# Patient Record
Sex: Female | Born: 1970 | Race: White | Hispanic: No | Marital: Married | Smoking: Never smoker
Health system: Southern US, Community
[De-identification: ages and names within clinical notes are randomized; demographics above are authoritative.]

## PROBLEM LIST (undated history)

## (undated) DIAGNOSIS — Z973 Presence of spectacles and contact lenses: Secondary | ICD-10-CM

## (undated) DIAGNOSIS — T753XXA Motion sickness, initial encounter: Secondary | ICD-10-CM

## (undated) DIAGNOSIS — R112 Nausea with vomiting, unspecified: Secondary | ICD-10-CM

## (undated) DIAGNOSIS — Z9889 Other specified postprocedural states: Secondary | ICD-10-CM

## (undated) DIAGNOSIS — E785 Hyperlipidemia, unspecified: Secondary | ICD-10-CM

## (undated) DIAGNOSIS — L719 Rosacea, unspecified: Secondary | ICD-10-CM

## (undated) DIAGNOSIS — K219 Gastro-esophageal reflux disease without esophagitis: Secondary | ICD-10-CM

## (undated) DIAGNOSIS — C4491 Basal cell carcinoma of skin, unspecified: Secondary | ICD-10-CM

## (undated) DIAGNOSIS — Z8489 Family history of other specified conditions: Secondary | ICD-10-CM

## (undated) DIAGNOSIS — G43909 Migraine, unspecified, not intractable, without status migrainosus: Secondary | ICD-10-CM

## (undated) HISTORY — PX: MOHS SURGERY: SUR867

## (undated) HISTORY — DX: Rosacea, unspecified: L71.9

## (undated) HISTORY — PX: WISDOM TOOTH EXTRACTION: SHX21

## (undated) HISTORY — DX: Gastro-esophageal reflux disease without esophagitis: K21.9

## (undated) HISTORY — DX: Basal cell carcinoma of skin, unspecified: C44.91

## (undated) HISTORY — DX: Hyperlipidemia, unspecified: E78.5

---

## 2015-07-12 ENCOUNTER — Other Ambulatory Visit: Payer: Self-pay | Admitting: Obstetrics and Gynecology

## 2015-07-12 DIAGNOSIS — Z1231 Encounter for screening mammogram for malignant neoplasm of breast: Secondary | ICD-10-CM

## 2015-07-20 ENCOUNTER — Ambulatory Visit: Payer: Self-pay

## 2015-07-22 ENCOUNTER — Ambulatory Visit
Admission: RE | Admit: 2015-07-22 | Discharge: 2015-07-22 | Disposition: A | Discharge status: Home / Self Care | Payer: BLUE CROSS/BLUE SHIELD | Source: Ambulatory Visit | Attending: Obstetrics and Gynecology | Admitting: Obstetrics and Gynecology

## 2015-07-22 ENCOUNTER — Other Ambulatory Visit: Payer: Self-pay | Admitting: Obstetrics and Gynecology

## 2015-07-22 DIAGNOSIS — Z1231 Encounter for screening mammogram for malignant neoplasm of breast: Secondary | ICD-10-CM

## 2016-07-18 ENCOUNTER — Other Ambulatory Visit: Payer: Self-pay | Admitting: Obstetrics and Gynecology

## 2016-07-18 DIAGNOSIS — Z1231 Encounter for screening mammogram for malignant neoplasm of breast: Secondary | ICD-10-CM

## 2017-10-02 ENCOUNTER — Other Ambulatory Visit: Payer: Self-pay | Admitting: Obstetrics and Gynecology

## 2017-10-02 DIAGNOSIS — Z1239 Encounter for other screening for malignant neoplasm of breast: Secondary | ICD-10-CM

## 2017-10-25 DIAGNOSIS — C4491 Basal cell carcinoma of skin, unspecified: Secondary | ICD-10-CM

## 2017-10-25 HISTORY — DX: Basal cell carcinoma of skin, unspecified: C44.91

## 2017-11-15 ENCOUNTER — Encounter: Payer: Self-pay | Admitting: Radiology

## 2017-11-15 ENCOUNTER — Ambulatory Visit
Admission: RE | Admit: 2017-11-15 | Discharge: 2017-11-15 | Disposition: A | Discharge status: Home / Self Care | Payer: Commercial Managed Care - PPO | Source: Ambulatory Visit | Attending: Obstetrics and Gynecology | Admitting: Obstetrics and Gynecology

## 2017-11-15 DIAGNOSIS — Z1239 Encounter for other screening for malignant neoplasm of breast: Secondary | ICD-10-CM

## 2017-11-15 DIAGNOSIS — Z1231 Encounter for screening mammogram for malignant neoplasm of breast: Secondary | ICD-10-CM | POA: Insufficient documentation

## 2019-03-31 ENCOUNTER — Other Ambulatory Visit: Payer: Self-pay | Admitting: Obstetrics and Gynecology

## 2019-03-31 DIAGNOSIS — Z1231 Encounter for screening mammogram for malignant neoplasm of breast: Secondary | ICD-10-CM

## 2019-05-22 ENCOUNTER — Encounter: Payer: Self-pay | Admitting: Emergency Medicine

## 2019-05-22 ENCOUNTER — Ambulatory Visit
Admission: EM | Admit: 2019-05-22 | Discharge: 2019-05-22 | Disposition: A | Discharge status: Home / Self Care | Payer: Commercial Managed Care - PPO

## 2019-05-22 ENCOUNTER — Other Ambulatory Visit: Payer: Self-pay

## 2019-05-22 DIAGNOSIS — J206 Acute bronchitis due to rhinovirus: Secondary | ICD-10-CM

## 2019-05-22 MED ORDER — PROMETHAZINE-DM 6.25-15 MG/5ML PO SYRP: 5.0000 mL | ORAL_SOLUTION | Freq: Four times a day (QID) | ORAL | 0 refills | Status: DC | PRN

## 2019-05-22 MED ORDER — MONTELUKAST SODIUM 10 MG PO TABS: 10.0000 mg | ORAL_TABLET | Freq: Every day | ORAL | 0 refills | Status: DC

## 2019-05-22 MED ORDER — PREDNISONE 10 MG (21) PO TBPK: ORAL_TABLET | Freq: Every day | ORAL | 0 refills | Status: DC

## 2019-05-22 MED ORDER — DOXYCYCLINE HYCLATE 100 MG PO CAPS: 100.0000 mg | ORAL_CAPSULE | Freq: Two times a day (BID) | ORAL | 0 refills | Status: DC

## 2019-05-22 MED ORDER — DM-GUAIFENESIN ER 30-600 MG PO TB12: 1.0000 | ORAL_TABLET | Freq: Two times a day (BID) | ORAL | 0 refills | Status: AC

## 2019-05-22 NOTE — Discharge Instructions (Addendum)
Take medications as prescribed. Drink plenty of fluids. Follow-up with your primary care if no improvement in symptoms. Go to ED if worse. Hope you feel better soon!  Merry Christmas!  Aldona Bar, FNP-C

## 2019-05-22 NOTE — ED Triage Notes (Signed)
Patient in office today c/o persistent cough on and off since Feb.   [id number] DM

## 2019-05-22 NOTE — ED Provider Notes (Signed)
Roderic Palau    CSN: [id number] Arrival date & time: 05/22/19  1559      History   Chief Complaint Chief Complaint  Patient presents with  . Cough    HPI Robin Benton is a 48 y.o. female.   Subjective:   Robin Benton is a 48 y.o. female here for evaluation of a cough.  The cough has been waxing and waning over time. Onset of symptoms was 10 months ago.  Current symptoms have been going on for the past few days.  Associated symptoms include sputum production and congestion. She denies any fevers, chills, body aches, sore throat, shortness of breath, wheezing, nausea, vomiting, diarrhea, headache, dizziness or change in taste/smell.  Patient does not have a history of asthma. Patient has not had recent travel. Patient does not have a history of smoking.  Patient denies any known exposure to COVID-19.    The following portions of the patient's history were reviewed and updated as appropriate: allergies, current medications, past family history, past medical history, past social history, past surgical history and problem list.        History reviewed. No pertinent past medical history.  There are no problems to display for this patient.   History reviewed. No pertinent surgical history.  OB History   No obstetric history on file.      Home Medications    Prior to Admission medications   Medication Sig Start Date End Date Taking? Authorizing Provider  metroNIDAZOLE (METROCREAM) 0.75 % cream  10/25/17  Yes [provider]  dextromethorphan-guaiFENesin (MUCINEX DM) 30-600 MG 12hr tablet Take 1 tablet by mouth 2 (two) times daily for 7 days. 05/22/19 05/29/19  Enrique Sack, FNP  doxycycline (VIBRAMYCIN) 100 MG capsule Take 1 capsule (100 mg total) by mouth 2 (two) times daily. 05/22/19   Enrique Sack, FNP  montelukast (SINGULAIR) 10 MG tablet Take 1 tablet (10 mg total) by mouth at bedtime for 14 days. 05/22/19 06/05/19  Enrique Sack, FNP    predniSONE (STERAPRED UNI-PAK 21 TAB) 10 MG (21) TBPK tablet Take by mouth daily. Take 6 tabs by mouth daily  for 2 days, then 5 tabs for 2 days, then 4 tabs for 2 days, then 3 tabs for 2 days, 2 tabs for 2 days, then 1 tab by mouth daily for 2 days 05/22/19   Enrique Sack, FNP  promethazine-dextromethorphan (PROMETHAZINE-DM) 6.25-15 MG/5ML syrup Take 5 mLs by mouth 4 (four) times daily as needed for cough. 05/22/19   Enrique Sack, FNP    Family History Family History  Problem Relation Age of Onset  . Breast cancer Neg Hx     Social History Social History   Tobacco Use  . Smoking status: Never Smoker  . Smokeless tobacco: Never Used  Substance Use Topics  . Alcohol use: Never  . Drug use: Never     Allergies   Patient has no known allergies.   Review of Systems Review of Systems  Constitutional: Negative for fever.  HENT: Positive for congestion.   Respiratory: Positive for cough. Negative for shortness of breath and wheezing.   Gastrointestinal: Negative.   Musculoskeletal: Negative.   Neurological: Negative.   All other systems reviewed and are negative.    Physical Exam Triage Vital Signs ED Triage Vitals  Enc Vitals Group     BP --      Pulse Rate 05/22/19 1612 79     Resp 05/22/19 1612 18     Temp 05/22/19 1612 99.1  F (37.3 C)     Temp src --      SpO2 05/22/19 1612 98 %     Weight 05/22/19 1609 180 lb (81.6 kg)     Height --      Head Circumference --      Peak Flow --      Pain Score 05/22/19 1609 7     Pain Loc --      Pain Edu? --      Excl. in [place]? --    No data found.  Updated Vital Signs Pulse 79   Temp 99.1 F (37.3 C)   Resp 18   Wt 180 lb (81.6 kg)   LMP 07/01/2015 (Approximate)   SpO2 98%   Visual Acuity Right Eye Distance:   Left Eye Distance:   Bilateral Distance:    Right Eye Near:   Left Eye Near:    Bilateral Near:     Physical Exam Vitals reviewed.  Constitutional:      General: She is not in acute  distress.    Appearance: Normal appearance. She is not ill-appearing or toxic-appearing.  HENT:     Head: Normocephalic.  Cardiovascular:     Rate and Rhythm: Normal rate and regular rhythm.  Pulmonary:     Effort: Pulmonary effort is normal. No respiratory distress.     Breath sounds: Normal breath sounds.  Musculoskeletal:        General: Normal range of motion.  Skin:    General: Skin is warm and dry.  Neurological:     General: No focal deficit present.     Mental Status: She is alert and oriented to person, place, and time.  Psychiatric:        Mood and Affect: Mood normal.      UC Treatments / Results  Labs (all labs ordered are listed, but only abnormal results are displayed) Labs Reviewed - No data to display  EKG   Radiology No results found.  Procedures Procedures (including critical care time)  Medications Ordered in UC Medications - No data to display  Initial Impression / Assessment and Plan / UC Course  I have reviewed the triage vital signs and the nursing notes.  Pertinent labs & imaging results that were available during my care of the patient were reviewed by me and considered in my medical decision making (see chart for details).    48 year old female presenting with a 50-month history of waxing and waning cough.  Current symptoms have been ongoing for the past few days.  Patient has a low-grade fever in the clinic.  Nontoxic-appearing.  Physical exam unremarkable. Antibiotics, steroids and anttitussives per medication orders. Avoid smoke and fumes.  Follow-up with PCP if no improvement in symptoms after completing prescribed regimen.  Go to the ED immediately if worse.  Today's evaluation has revealed no signs of a dangerous process. Discussed diagnosis with patient and/or guardian. Patient and/or guardian aware of their diagnosis, possible red flag symptoms to watch out for and need for close follow up. Patient and/or guardian understands verbal and  written discharge instructions. Patient and/or guardian comfortable with plan and disposition.  Patient and/or guardian has a clear mental status at this time, good insight into illness (after discussion and teaching) and has clear judgment to make decisions regarding their care  This care was provided during an unprecedented National Emergency due to the Novel Coronavirus (COVID-19) pandemic. COVID-19 infections and transmission risks place heavy strains on healthcare resources.  As  this pandemic evolves, our facility, providers, and staff strive to respond fluidly, to remain operational, and to provide care relative to available resources and information. Outcomes are unpredictable and treatments are without well-defined guidelines. Further, the impact of COVID-19 on all aspects of urgent care, including the impact to patients seeking care for reasons other than COVID-19, is unavoidable during this national emergency. At this time of the global pandemic, management of patients has significantly changed, even for non-COVID positive patients given high local and regional COVID volumes at this time requiring high healthcare system and resource utilization. The standard of care for management of both COVID suspected and non-COVID suspected patients continues to change rapidly at the local, regional, national, and global levels. This patient was worked up and treated to the best available but ever changing evidence and resources available at this current time.   Documentation was completed with the aid of voice recognition software. Transcription may contain typographical errors. Final Clinical Impressions(s) / UC Diagnoses   Final diagnoses:  Acute bronchitis due to Rhinovirus     Discharge Instructions     Take medications as prescribed. Drink plenty of fluids. Follow-up with your primary care if no improvement in symptoms. Go to ED if worse. Hope you feel better soon!  Merry Christmas!  Aldona Bar,  FNP-C     ED Prescriptions    Medication Sig Dispense Auth. Provider   doxycycline (VIBRAMYCIN) 100 MG capsule Take 1 capsule (100 mg total) by mouth 2 (two) times daily. 20 capsule Enrique Sack, FNP   predniSONE (STERAPRED UNI-PAK 21 TAB) 10 MG (21) TBPK tablet Take by mouth daily. Take 6 tabs by mouth daily  for 2 days, then 5 tabs for 2 days, then 4 tabs for 2 days, then 3 tabs for 2 days, 2 tabs for 2 days, then 1 tab by mouth daily for 2 days 42 tablet Linkyn Gobin, [place], FNP   dextromethorphan-guaiFENesin (MUCINEX DM) 30-600 MG 12hr tablet Take 1 tablet by mouth 2 (two) times daily for 7 days. 14 tablet Enrique Sack, FNP   promethazine-dextromethorphan (PROMETHAZINE-DM) 6.25-15 MG/5ML syrup Take 5 mLs by mouth 4 (four) times daily as needed for cough. 118 mL Jailon Schaible, Aldona Bar, FNP   montelukast (SINGULAIR) 10 MG tablet Take 1 tablet (10 mg total) by mouth at bedtime for 14 days. 14 tablet Enrique Sack, FNP     PDMP not reviewed this encounter.   Enrique Sack, [place] 05/22/19 1722

## 2019-06-05 HISTORY — PX: BREAST CYST ASPIRATION: SHX578

## 2019-06-23 ENCOUNTER — Ambulatory Visit
Admission: RE | Admit: 2019-06-23 | Discharge: 2019-06-23 | Disposition: A | Discharge status: Home / Self Care | Payer: Commercial Managed Care - PPO | Source: Ambulatory Visit | Attending: Obstetrics and Gynecology | Admitting: Obstetrics and Gynecology

## 2019-06-23 DIAGNOSIS — Z1231 Encounter for screening mammogram for malignant neoplasm of breast: Secondary | ICD-10-CM | POA: Diagnosis present

## 2019-06-29 ENCOUNTER — Other Ambulatory Visit: Payer: Self-pay | Admitting: Obstetrics and Gynecology

## 2019-06-29 DIAGNOSIS — R928 Other abnormal and inconclusive findings on diagnostic imaging of breast: Secondary | ICD-10-CM

## 2019-06-29 DIAGNOSIS — N6320 Unspecified lump in the left breast, unspecified quadrant: Secondary | ICD-10-CM

## 2019-06-29 DIAGNOSIS — N6310 Unspecified lump in the right breast, unspecified quadrant: Secondary | ICD-10-CM

## 2019-07-01 ENCOUNTER — Ambulatory Visit
Admission: RE | Admit: 2019-07-01 | Discharge: 2019-07-01 | Disposition: A | Discharge status: Home / Self Care | Payer: Commercial Managed Care - PPO | Source: Ambulatory Visit | Attending: Obstetrics and Gynecology | Admitting: Obstetrics and Gynecology

## 2019-07-01 DIAGNOSIS — R928 Other abnormal and inconclusive findings on diagnostic imaging of breast: Secondary | ICD-10-CM | POA: Diagnosis present

## 2019-07-01 DIAGNOSIS — N6320 Unspecified lump in the left breast, unspecified quadrant: Secondary | ICD-10-CM

## 2019-07-01 DIAGNOSIS — N6310 Unspecified lump in the right breast, unspecified quadrant: Secondary | ICD-10-CM

## 2019-09-11 ENCOUNTER — Ambulatory Visit (INDEPENDENT_AMBULATORY_CARE_PROVIDER_SITE_OTHER)
Admission: RE | Admit: 2019-09-11 | Discharge: 2019-09-11 | Disposition: A | Discharge status: Home / Self Care | Payer: Commercial Managed Care - PPO | Source: Ambulatory Visit

## 2019-09-11 DIAGNOSIS — N3090 Cystitis, unspecified without hematuria: Secondary | ICD-10-CM | POA: Diagnosis not present

## 2019-09-11 MED ORDER — CEPHALEXIN 500 MG PO CAPS: 500.0000 mg | ORAL_CAPSULE | Freq: Two times a day (BID) | ORAL | 0 refills | Status: AC

## 2019-09-11 NOTE — ED Provider Notes (Signed)
Virtual Visit via Video Note:  Chantella Winburn  initiated request for Telemedicine visit with [facility] team. I connected with Pearla Dubonnet  on 09/11/2019 at 10:58 AM  for a synchronized telemedicine visit using a video enabled HIPPA compliant telemedicine application. I verified that I am speaking with Pearla Dubonnet  using two identifiers. Sharion Balloon, NP  was physically located in a [facility] Urgent care site and Libbi Maberry was located at a different location.   The limitations of evaluation and management by telemedicine as well as the availability of in-person appointments were discussed. Patient was informed that she  may incur a bill ( including co-pay) for this virtual visit encounter. Celica Steidel  expressed understanding and gave verbal consent to proceed with virtual visit.     History of Present Illness:Robin Benton  is a 49 y.o. female presents for evaluation of dysuria, suprapubic pressure, frequency x 3-4 days.  Treatment attempted with increased water intake.  She denies fever, chills, abdominal pain, back pain, vaginal discharge, rash, lesions, or other symptoms.     No Known Allergies   History reviewed. No pertinent past medical history.   Social History   Tobacco Use  . Smoking status: Never Smoker  . Smokeless tobacco: Never Used  Substance Use Topics  . Alcohol use: Never  . Drug use: Never   ROS: as stated in HPI.  All other systems reviewed and negative.      Observations/Objective: Physical Exam  VITALS: Patient denies fever. GENERAL: Alert, appears well and in no acute distress. HEENT: Atraumatic. NECK: Normal movements of the head and neck. CARDIOPULMONARY: No increased WOB. Speaking in clear sentences. I:E ratio WNL.  MS: Moves all visible extremities without noticeable abnormality. PSYCH: Pleasant and cooperative, well-groomed. Speech normal rate and rhythm. Affect is appropriate. Insight and judgement are appropriate. Attention is focused, linear, and  appropriate.  NEURO: CN grossly intact. Oriented as arrived to appointment on time with no prompting. Moves both UE equally.  SKIN: No obvious lesions, wounds, erythema, or cyanosis noted on face or hands.   Assessment and Plan:    ICD-10-CM   1. Cystitis  N30.90        Follow Up Instructions: Treating with Keflex.  Instructed patient to follow-up with her PCP or come here to be seen in person if her symptoms or not improving.  Discussed that she will need to be seen in person because we will need a urine specimen at that time.  Patient agrees to plan of care.      I discussed the assessment and treatment plan with the patient. The patient was provided an opportunity to ask questions and all were answered. The patient agreed with the plan and demonstrated an understanding of the instructions.   The patient was advised to call back or seek an in-person evaluation if the symptoms worsen or if the condition fails to improve as anticipated.      Sharion Balloon, NP  09/11/2019 10:58 AM         Sharion Balloon, NP 09/11/19 1058

## 2019-09-11 NOTE — Discharge Instructions (Addendum)
Take the antibiotic as directed.    Follow up with your primary care provider or come here to be seen in person if your symptoms are not improving.    

## 2019-09-17 ENCOUNTER — Ambulatory Visit: Payer: Commercial Managed Care - PPO

## 2019-09-18 ENCOUNTER — Ambulatory Visit: Payer: Commercial Managed Care - PPO

## 2019-09-29 ENCOUNTER — Other Ambulatory Visit: Payer: Self-pay | Admitting: Obstetrics and Gynecology

## 2019-09-29 DIAGNOSIS — N6320 Unspecified lump in the left breast, unspecified quadrant: Secondary | ICD-10-CM

## 2019-10-07 ENCOUNTER — Ambulatory Visit
Admission: RE | Admit: 2019-10-07 | Discharge: 2019-10-07 | Disposition: A | Discharge status: Home / Self Care | Payer: Commercial Managed Care - PPO | Source: Ambulatory Visit | Attending: Obstetrics and Gynecology | Admitting: Obstetrics and Gynecology

## 2019-10-07 DIAGNOSIS — N6320 Unspecified lump in the left breast, unspecified quadrant: Secondary | ICD-10-CM | POA: Insufficient documentation

## 2019-10-08 ENCOUNTER — Other Ambulatory Visit: Payer: Self-pay | Admitting: Obstetrics and Gynecology

## 2019-10-12 ENCOUNTER — Other Ambulatory Visit: Payer: Self-pay | Admitting: Obstetrics and Gynecology

## 2019-10-12 DIAGNOSIS — R928 Other abnormal and inconclusive findings on diagnostic imaging of breast: Secondary | ICD-10-CM

## 2019-10-12 DIAGNOSIS — N6320 Unspecified lump in the left breast, unspecified quadrant: Secondary | ICD-10-CM

## 2019-10-19 ENCOUNTER — Ambulatory Visit
Admission: RE | Admit: 2019-10-19 | Discharge: 2019-10-19 | Disposition: A | Discharge status: Home / Self Care | Payer: Commercial Managed Care - PPO | Source: Ambulatory Visit | Attending: Obstetrics and Gynecology | Admitting: Obstetrics and Gynecology

## 2019-10-19 DIAGNOSIS — R928 Other abnormal and inconclusive findings on diagnostic imaging of breast: Secondary | ICD-10-CM | POA: Insufficient documentation

## 2019-10-19 DIAGNOSIS — N6320 Unspecified lump in the left breast, unspecified quadrant: Secondary | ICD-10-CM | POA: Diagnosis present

## 2019-10-23 ENCOUNTER — Ambulatory Visit: Payer: Commercial Managed Care - PPO | Attending: Internal Medicine

## 2019-10-23 DIAGNOSIS — Z23 Encounter for immunization: Secondary | ICD-10-CM

## 2019-10-23 NOTE — Progress Notes (Signed)
   Covid-19 Vaccination Clinic  Name:  Robin Benton    MRN: [id number] DOB: 25-Sep-1970  10/23/2019  [name] was observed post Covid-19 immunization for 15 minutes without incident. She was provided with Vaccine Information Sheet and instruction to access the V-Safe system.   [name] was instructed to call 911 with any severe reactions post vaccine: Marland Kitchen Difficulty breathing  . Swelling of face and throat  . A fast heartbeat  . A bad rash all over body  . Dizziness and weakness   Immunizations Administered    Name Date Dose VIS Date Route   Pfizer COVID-19 Vaccine 10/23/2019 10:39 AM 0.3 mL 07/29/2018 Intramuscular   Manufacturer: [place]   Lot: [id number]   [place]: [id number]

## 2019-11-14 ENCOUNTER — Other Ambulatory Visit: Payer: Self-pay

## 2019-11-14 ENCOUNTER — Ambulatory Visit: Payer: Commercial Managed Care - PPO | Attending: Internal Medicine

## 2019-11-14 DIAGNOSIS — Z23 Encounter for immunization: Secondary | ICD-10-CM

## 2019-11-14 NOTE — Progress Notes (Signed)
   Covid-19 Vaccination Clinic  Name:  Robin Benton    MRN: [id number] DOB: 01-24-1971  11/14/2019  Robin Benton was observed post Covid-19 immunization for 15 minutes without incident. She was provided with Vaccine Information Sheet and instruction to access the V-Safe system.   Robin Benton was instructed to call 911 with any severe reactions post vaccine: Marland Kitchen Difficulty breathing  . Swelling of face and throat  . A fast heartbeat  . A bad rash all over body  . Dizziness and weakness   Immunizations Administered    Name Date Dose VIS Date Route   Pfizer COVID-19 Vaccine 11/14/2019 10:35 AM 0.3 mL 07/29/2018 Intranasal   Manufacturer: [place]   Lot: [id number]   [place]: [id number]

## 2020-01-08 ENCOUNTER — Other Ambulatory Visit: Payer: Self-pay

## 2020-01-08 ENCOUNTER — Encounter: Payer: Self-pay | Admitting: Family Medicine

## 2020-01-08 ENCOUNTER — Ambulatory Visit: Payer: Commercial Managed Care - PPO | Admitting: Family Medicine

## 2020-01-08 VITALS — BP 112/70 | HR 75 | Temp 98.9°F | Ht 64.33 in | Wt 194.0 lb

## 2020-01-08 DIAGNOSIS — Z6832 Body mass index (BMI) 32.0-32.9, adult: Secondary | ICD-10-CM | POA: Diagnosis not present

## 2020-01-08 DIAGNOSIS — N926 Irregular menstruation, unspecified: Secondary | ICD-10-CM | POA: Diagnosis not present

## 2020-01-08 DIAGNOSIS — Z23 Encounter for immunization: Secondary | ICD-10-CM

## 2020-01-08 DIAGNOSIS — K219 Gastro-esophageal reflux disease without esophagitis: Secondary | ICD-10-CM | POA: Diagnosis not present

## 2020-01-08 DIAGNOSIS — E559 Vitamin D deficiency, unspecified: Secondary | ICD-10-CM | POA: Diagnosis not present

## 2020-01-08 DIAGNOSIS — E6609 Other obesity due to excess calories: Secondary | ICD-10-CM | POA: Diagnosis not present

## 2020-01-08 LAB — CBC WITH DIFFERENTIAL/PLATELET
Basophils Absolute: 0 10*3/uL (ref 0.0–0.1)
Basophils Relative: 0.6 % (ref 0.0–3.0)
Eosinophils Absolute: 0.3 10*3/uL (ref 0.0–0.7)
Eosinophils Relative: 3.6 % (ref 0.0–5.0)
HCT: 39.5 % (ref 36.0–46.0)
Hemoglobin: 13.3 g/dL (ref 12.0–15.0)
Lymphocytes Relative: 29.9 % (ref 12.0–46.0)
Lymphs Abs: 2.3 10*3/uL (ref 0.7–4.0)
MCHC: 33.6 g/dL (ref 30.0–36.0)
MCV: 89.3 fl (ref 78.0–100.0)
Monocytes Absolute: 1 10*3/uL (ref 0.1–1.0)
Monocytes Relative: 12.8 % — ABNORMAL HIGH (ref 3.0–12.0)
Neutro Abs: 4 10*3/uL (ref 1.4–7.7)
Neutrophils Relative %: 53.1 % (ref 43.0–77.0)
Platelets: 408 10*3/uL — ABNORMAL HIGH (ref 150.0–400.0)
RBC: 4.42 Mil/uL (ref 3.87–5.11)
RDW: 13.5 % (ref 11.5–15.5)
WBC: 7.6 10*3/uL (ref 4.0–10.5)

## 2020-01-08 LAB — LIPID PANEL
Cholesterol: 229 mg/dL — ABNORMAL HIGH (ref 0–200)
HDL: 37.3 mg/dL — ABNORMAL LOW (ref >39.00)
LDL Cholesterol: 161 mg/dL — ABNORMAL HIGH (ref 0–99)
NonHDL: 191.71
Total CHOL/HDL Ratio: 6
Triglycerides: 156 mg/dL — ABNORMAL HIGH (ref 0.0–149.0)
VLDL: 31.2 mg/dL (ref 0.0–40.0)

## 2020-01-08 LAB — COMPREHENSIVE METABOLIC PANEL
ALT: 11 U/L (ref 0–35)
AST: 13 U/L (ref 0–37)
Albumin: 4.3 g/dL (ref 3.5–5.2)
Alkaline Phosphatase: 66 U/L (ref 39–117)
BUN: 10 mg/dL (ref 6–23)
CO2: 29 mEq/L (ref 19–32)
Calcium: 9.6 mg/dL (ref 8.4–10.5)
Chloride: 105 mEq/L (ref 96–112)
Creatinine, Ser: 0.82 mg/dL (ref 0.40–1.20)
GFR: 74.75 mL/min (ref >60.00)
Glucose, Bld: 82 mg/dL (ref 70–99)
Potassium: 4.8 mEq/L (ref 3.5–5.1)
Sodium: 137 mEq/L (ref 135–145)
Total Bilirubin: 0.5 mg/dL (ref 0.2–1.2)
Total Protein: 6.7 g/dL (ref 6.0–8.3)

## 2020-01-08 LAB — VITAMIN D 25 HYDROXY (VIT D DEFICIENCY, FRACTURES): VITD: 50.05 ng/mL (ref 30.00–100.00)

## 2020-01-08 LAB — FERRITIN: Ferritin: 14.8 ng/mL (ref 10.0–291.0)

## 2020-01-08 LAB — TSH: TSH: 2.43 u[IU]/mL (ref 0.35–4.50)

## 2020-01-08 NOTE — Patient Instructions (Addendum)
Take nexium with dinner  Try 1/2 teaspoon apple cider vinegar with 1/2 glass water  Keep a log of symptoms/ treatments  A resource that I like is www.dietdoctor.com/diabetes/diet  Youtube- Dr. Sharman Cheek, Dr. Enrigue Catena  Premier Protein shakes  Here are some guidelines to help you with meal planning -  Avoid all processed and packaged foods (bread, pasta, crackers, chips, etc) and beverages containing calories.  Avoid added sugars and excessive natural sugars.  Attention to how you feel if you consume artificial sweeteners.  Do they make you more hungry or raise your blood sugar?  With every meal and snack, aim to get 20 g of protein (3 ounces of meat, 4 ounces of fish, 3 eggs, protein powder, 1 cup [place] yogurt, 1 cup cottage cheese, etc.)  Increase fiber in the form of non-starchy vegetables.  These help you feel full with very little carbohydrates and are good for gut health.  Eat 1 serving healthy carb per meal- 1/2 cup brown rice, beans, potato, corn- pay attention to whether or not this significantly raises your blood sugar. If it does, reduce the frequency you consume these.   Eat 2-3 servings of lower sugar fruits daily.  This includes berries, apples, oranges, peaches, pears, one half banana.  Have small amounts of good fats such as avocado, nuts, olive oil, nut butters, olives.  Add a little cheese to your salads to make them tasty.    Perimenopause  Perimenopause is the normal time of life before and after menstrual periods stop completely (menopause). Perimenopause can begin 2-8 years before menopause, and it usually lasts for 1 year after menopause. During perimenopause, the ovaries may or may not produce an egg. What are the causes? This condition is caused by a natural change in hormone levels that happens as you get older. What increases the risk? This condition is more likely to start at an earlier age if you have certain medical conditions or treatments,  including:  A tumor of the pituitary gland in the brain.  A disease that affects the ovaries and hormone production.  Radiation treatment for cancer.  Certain cancer treatments, such as chemotherapy or hormone (anti-estrogen) therapy.  Heavy smoking and excessive alcohol use.  Family history of early menopause. What are the signs or symptoms? Perimenopausal changes affect each woman differently. Symptoms of this condition may include:  Hot flashes.  Night sweats.  Irregular menstrual periods.  Decreased sex drive.  Vaginal dryness.  Headaches.  Mood swings.  Depression.  Memory problems or trouble concentrating.  Irritability.  Tiredness.  Weight gain.  Anxiety.  Trouble getting pregnant. How is this diagnosed? This condition is diagnosed based on your medical history, a physical exam, your age, your menstrual history, and your symptoms. Hormone tests may also be done. How is this treated? In some cases, no treatment is needed. You and your health care provider should make a decision together about whether treatment is necessary. Treatment will be based on your individual condition and preferences. Various treatments are available, such as:  Menopausal hormone therapy (MHT).  Medicines to treat specific symptoms.  Acupuncture.  Vitamin or herbal supplements. Before starting treatment, make sure to let your health care provider know if you have a personal or family history of:  Heart disease.  Breast cancer.  Blood clots.  Diabetes.  Osteoporosis. Follow these instructions at home: Lifestyle  Do not use any products that contain nicotine or tobacco, such as cigarettes and e-cigarettes. If you need help quitting,  ask your health care provider.  Eat a balanced diet that includes fresh fruits and vegetables, whole grains, soybeans, eggs, lean meat, and low-fat dairy.  Get at least 30 minutes of physical activity on 5 or more days each  week.  Avoid alcoholic and caffeinated beverages, as well as spicy foods. This may help prevent hot flashes.  Get 7-8 hours of sleep each night.  Dress in layers that can be removed to help you manage hot flashes.  Find ways to manage stress, such as deep breathing, meditation, or journaling. General instructions  Keep track of your menstrual periods, including: ? When they occur. ? How heavy they are and how long they last. ? How much time passes between periods.  Keep track of your symptoms, noting when they start, how often you have them, and how long they last.  Take over-the-counter and prescription medicines only as told by your health care provider.  Take vitamin supplements only as told by your health care provider. These may include calcium, vitamin E, and vitamin D.  Use vaginal lubricants or moisturizers to help with vaginal dryness and improve comfort during sex.  Talk with your health care provider before starting any herbal supplements.  Keep all follow-up visits as told by your health care provider. This is important. This includes any group therapy or counseling. Contact a health care provider if:  You have heavy vaginal bleeding or pass blood clots.  Your period lasts more than 2 days longer than normal.  Your periods are recurring sooner than 21 days.  You bleed after having sex. Get help right away if:  You have chest pain, trouble breathing, or trouble talking.  You have severe depression.  You have pain when you urinate.  You have severe headaches.  You have vision problems. Summary  Perimenopause is the time when a woman's body begins to move into menopause. This may happen naturally or as a result of other health problems or medical treatments.  Perimenopause can begin 2-8 years before menopause, and it usually lasts for 1 year after menopause.  Perimenopausal symptoms can be managed through medicines, lifestyle changes, and complementary  therapies such as acupuncture. This information is not intended to replace advice given to you by your health care provider. Make sure you discuss any questions you have with your health care provider. Document Revised: 05/03/2017 Document Reviewed: 06/26/2016 Elsevier Patient Education  [place].  Gastroesophageal Reflux Disease, Adult Gastroesophageal reflux (GER) happens when acid from the stomach flows up into the tube that connects the mouth and the stomach (esophagus). Normally, food travels down the esophagus and stays in the stomach to be digested. However, when a person has GER, food and stomach acid sometimes move back up into the esophagus. If this becomes a more serious problem, the person may be diagnosed with a disease called gastroesophageal reflux disease (GERD). GERD occurs when the reflux:  Happens often.  Causes frequent or severe symptoms.  Causes problems such as damage to the esophagus. When stomach acid comes in contact with the esophagus, the acid may cause soreness (inflammation) in the esophagus. Over time, GERD may create small holes (ulcers) in the lining of the esophagus. What are the causes? This condition is caused by a problem with the muscle between the esophagus and the stomach (lower esophageal sphincter, or LES). Normally, the LES muscle closes after food passes through the esophagus to the stomach. When the LES is weakened or abnormal, it does not close properly, and  that allows food and stomach acid to go back up into the esophagus. The LES can be weakened by certain dietary substances, medicines, and medical conditions, including:  Tobacco use.  Pregnancy.  Having a hiatal hernia.  Alcohol use.  Certain foods and beverages, such as coffee, chocolate, onions, and peppermint. What increases the risk? You are more likely to develop this condition if you:  Have an increased body weight.  Have a connective tissue disorder.  Use NSAID  medicines. What are the signs or symptoms? Symptoms of this condition include:  Heartburn.  Difficult or painful swallowing.  The feeling of having a lump in the throat.  Abitter taste in the mouth.  Bad breath.  Having a large amount of saliva.  Having an upset or bloated stomach.  Belching.  Chest pain. Different conditions can cause chest pain. Make sure you see your health care provider if you experience chest pain.  Shortness of breath or wheezing.  Ongoing (chronic) cough or a night-time cough.  Wearing away of tooth enamel.  Weight loss. How is this diagnosed? Your health care provider will take a medical history and perform a physical exam. To determine if you have mild or severe GERD, your health care provider may also monitor how you respond to treatment. You may also have tests, including:  A test to examine your stomach and esophagus with a small camera (endoscopy).  A test thatmeasures the acidity level in your esophagus.  A test thatmeasures how much pressure is on your esophagus.  A barium swallow or modified barium swallow test to show the shape, size, and functioning of your esophagus. How is this treated? The goal of treatment is to help relieve your symptoms and to prevent complications. Treatment for this condition may vary depending on how severe your symptoms are. Your health care provider may recommend:  Changes to your diet.  Medicine.  Surgery. Follow these instructions at home: Eating and drinking   Follow a diet as recommended by your health care provider. This may involve avoiding foods and drinks such as: ? Coffee and tea (with or without caffeine). ? Drinks that containalcohol. ? Energy drinks and sports drinks. ? Carbonated drinks or sodas. ? Chocolate and cocoa. ? Peppermint and mint flavorings. ? Garlic and onions. ? Horseradish. ? Spicy and acidic foods, including peppers, chili powder, curry powder, vinegar, hot  sauces, and barbecue sauce. ? Citrus fruit juices and citrus fruits, such as oranges, lemons, and limes. ? Tomato-based foods, such as red sauce, chili, salsa, and pizza with red sauce. ? Fried and fatty foods, such as donuts, french fries, potato chips, and high-fat dressings. ? High-fat meats, such as hot dogs and fatty cuts of red and white meats, such as rib eye steak, sausage, ham, and bacon. ? High-fat dairy items, such as whole milk, butter, and cream cheese.  Eat small, frequent meals instead of large meals.  Avoid drinking large amounts of liquid with your meals.  Avoid eating meals during the 2-3 hours before bedtime.  Avoid lying down right after you eat.  Do not exercise right after you eat. Lifestyle   Do not use any products that contain nicotine or tobacco, such as cigarettes, e-cigarettes, and chewing tobacco. If you need help quitting, ask your health care provider.  Try to reduce your stress by using methods such as yoga or meditation. If you need help reducing stress, ask your health care provider.  If you are overweight, reduce your weight to an amount  that is healthy for you. Ask your health care provider for guidance about a safe weight loss goal. General instructions  Pay attention to any changes in your symptoms.  Take over-the-counter and prescription medicines only as told by your health care provider. Do not take aspirin, ibuprofen, or other NSAIDs unless your health care provider told you to do so.  Wear loose-fitting clothing. Do not wear anything tight around your waist that causes pressure on your abdomen.  Raise (elevate) the head of your bed about 6 inches (15 cm).  Avoid bending over if this makes your symptoms worse.  Keep all follow-up visits as told by your health care provider. This is important. Contact a health care provider if:  You have: ? New symptoms. ? Unexplained weight loss. ? Difficulty swallowing or it hurts to  swallow. ? Wheezing or a persistent cough. ? A hoarse voice.  Your symptoms do not improve with treatment. Get help right away if you:  Have pain in your arms, neck, jaw, teeth, or back.  Feel sweaty, dizzy, or light-headed.  Have chest pain or shortness of breath.  Vomit and your vomit looks like blood or coffee grounds.  Faint.  Have stool that is bloody or black.  Cannot swallow, drink, or eat. Summary  Gastroesophageal reflux happens when acid from the stomach flows up into the esophagus. GERD is a disease in which the reflux happens often, causes frequent or severe symptoms, or causes problems such as damage to the esophagus.  Treatment for this condition may vary depending on how severe your symptoms are. Your health care provider may recommend diet and lifestyle changes, medicine, or surgery.  Contact a health care provider if you have new or worsening symptoms.  Take over-the-counter and prescription medicines only as told by your health care provider. Do not take aspirin, ibuprofen, or other NSAIDs unless your health care provider told you to do so.  Keep all follow-up visits as told by your health care provider. This is important. This information is not intended to replace advice given to you by your health care provider. Make sure you discuss any questions you have with your health care provider. Document Revised: 11/27/2017 Document Reviewed: 11/27/2017 Elsevier Patient Education  [place].

## 2020-01-08 NOTE — Progress Notes (Signed)
Subjective:    Patient ID: Robin Benton, female    DOB: 29-Jul-1970, 49 y.o.   MRN: [id number]  HPI Chief Complaint  Patient presents with  . Establish Care    has been going to [facility]... no PCP... Flu--2018.... TD--unsure.... Covid--Pfizer--10/23/2019,11/14/2019.... Pap--03/2019...Marland Kitchen Mamm--10/07/2019... Vision--annually...   . Gastroesophageal Reflux    has been taking Nexuim QD x 2 years... has been having some regurgitation and not managed well on Nexium    This is a 49 yo female who presents today to establish care. Works as a Copywriter, advertising. Lives with her husband, two teenagers, 54 yo out of the house.    Last CPE- 03/2019 Mammo- 10/07/2019 Pap-03/2019 Colonoscopy- never Tdap- unknown Flu- some years Eye- annual, March 2020 Dental- regular Exercise- no, used to walk Sleep- not as good as she used to Menstrual cycles irregular  GERD- has been on Nexium for several years, symptoms triggered by chocolate, tomato. Has cough for several years, dry. Burning in throat. Takes Nexxium in am. Symptoms only night. Easts dinner 7-730, goes to bed around 10. Was sleeping on 2 pillows but was hurting her neck. Previously on omeprazole, didn't seem to work so she started otc Nexium.   Obesity- variable, eats an apple or banana if she eats breakfast, Lunch- biggest meal, goes out ([place], [place], [employer]) , sandwich, chicken, eating fewer salads, pizza, dinner- doesn't eat much, small meal. Drinks water, 1-2 soda a week, occasional sweet tea. Snacks- granola bar, nuts.   Review of Systems No SOB, wheeze, diarrhea/constipaton     Objective:   Physical Exam Vitals reviewed.  Constitutional:      General: She is not in acute distress.    Appearance: Normal appearance. She is obese. She is not ill-appearing, toxic-appearing or diaphoretic.  HENT:     Head: Normocephalic and atraumatic.  Eyes:     Conjunctiva/sclera: Conjunctivae normal.  Cardiovascular:     Rate and  Rhythm: Normal rate and regular rhythm.     Heart sounds: Normal heart sounds.  Pulmonary:     Effort: Pulmonary effort is normal.     Breath sounds: Normal breath sounds.  Abdominal:     General: Abdomen is flat. Bowel sounds are normal. There is no distension.     Palpations: Abdomen is soft. There is no mass.     Tenderness: There is no abdominal tenderness. There is no guarding or rebound.     Hernia: No hernia is present.  Skin:    General: Skin is warm and dry.  Neurological:     Mental Status: She is alert and oriented to person, place, and time.  Psychiatric:        Mood and Affect: Mood normal.        Behavior: Behavior normal.        Thought Content: Thought content normal.        Judgment: Judgment normal.       Temp 98.9 F (37.2 C) (Temporal)   Ht 5' 4.33" (1.634 m)   Wt 194 lb (88 kg)   BMI 32.96 kg/m  Wt Readings from Last 3 Encounters:  01/08/20 194 lb (88 kg)  05/22/19 180 lb (81.6 kg)       Assessment & Plan:  1. Abnormal menstrual periods - likely perimenopausal, discussed with patient and provided written information - CBC with Differential - Comprehensive metabolic panel - TSH - Ferritin  2. Class 1 obesity due to excess calories without serious comorbidity with body mass  index (BMI) of 32.0 to 32.9 in adult - encouraged weight loss and discussed healthy food choices, avoiding beverages with calories, increased water intake - Lipid Panel  3. Vitamin D deficiency - Vitamin D, 25-hydroxy  4. Gastroesophageal reflux disease, unspecified whether esophagitis present - discussed moving PPI to evening, avoiding triggers, raising head of bed - she will let me know if no improvement  - follow up in fall for CPE  This visit occurred during the SARS-CoV-2 public health emergency.  Safety protocols were in place, including screening questions prior to the visit, additional usage of staff PPE, and extensive cleaning of exam room while observing  appropriate contact time as indicated for disinfecting solutions.      Clarene Reamer, FNP-BC  [facility] at [facility], [facility]  01/09/2020 7:48 AM

## 2020-01-09 ENCOUNTER — Encounter: Payer: Self-pay | Admitting: Family Medicine

## 2020-01-09 DIAGNOSIS — N926 Irregular menstruation, unspecified: Secondary | ICD-10-CM | POA: Insufficient documentation

## 2020-01-09 DIAGNOSIS — E559 Vitamin D deficiency, unspecified: Secondary | ICD-10-CM | POA: Insufficient documentation

## 2020-01-09 DIAGNOSIS — K219 Gastro-esophageal reflux disease without esophagitis: Secondary | ICD-10-CM | POA: Insufficient documentation

## 2020-01-09 DIAGNOSIS — Z6832 Body mass index (BMI) 32.0-32.9, adult: Secondary | ICD-10-CM | POA: Insufficient documentation

## 2020-04-11 ENCOUNTER — Encounter: Payer: Commercial Managed Care - PPO | Admitting: Family Medicine

## 2020-05-23 ENCOUNTER — Ambulatory Visit (INDEPENDENT_AMBULATORY_CARE_PROVIDER_SITE_OTHER): Payer: Commercial Managed Care - PPO | Admitting: Family Medicine

## 2020-05-23 ENCOUNTER — Other Ambulatory Visit: Payer: Self-pay

## 2020-05-23 ENCOUNTER — Encounter: Payer: Self-pay | Admitting: Family Medicine

## 2020-05-23 VITALS — BP 118/72 | HR 97 | Temp 98.2°F | Ht 64.5 in | Wt 185.0 lb

## 2020-05-23 DIAGNOSIS — G43109 Migraine with aura, not intractable, without status migrainosus: Secondary | ICD-10-CM

## 2020-05-23 DIAGNOSIS — N816 Rectocele: Secondary | ICD-10-CM

## 2020-05-23 DIAGNOSIS — K5909 Other constipation: Secondary | ICD-10-CM

## 2020-05-23 DIAGNOSIS — Z0000 Encounter for general adult medical examination without abnormal findings: Secondary | ICD-10-CM

## 2020-05-23 MED ORDER — SUMATRIPTAN SUCCINATE 50 MG PO TABS: 50.0000 mg | ORAL_TABLET | ORAL | 1 refills | Status: DC | PRN

## 2020-05-23 NOTE — Progress Notes (Signed)
Subjective:    Patient ID: Robin Benton, female    DOB: [birth year], 49 y.o.   MRN: [id number]  HPI Chief Complaint  Patient presents with  . Annual Exam   This is a 49 yo female who presents today for annual exam. Has been doing well.    Last CPE- 03/24/2019 Mammo- 10/07/2019 Pap- 10/01/2017 per outside records, "WNL" Tdap-01/08/2020 Flu- not yet, will have today Covid 19 vaccine- vaccinated Eye- March 2021 Dental- regular Exercise- walking more  Headaches- long standing history of migraines, run in family, over last 10 weeks, having headache behind left eye, on top of head, "low simmer," was taking 1 sudafed and 2 excedrin migraine to manage symptoms. Hasn't taken anything recently. Occasional sensitive to light/ sound, sees flashing lights prior, a couple of times has had nausea/ vomiting. Has quit caffeine, increased her water. Wearing readers, on computer a lot.   Is having difficulty moving bowels, having to insert her finger into her vagina and push out stool. Occasional hard stool, not always. Some sensation of bulging of rectum. Not generally with blood, had one episode with recent diarrheal illness. Last bm last night. Denies dyspareunia.     Review of Systems  Constitutional: Negative.   HENT: Negative.   Eyes: Negative.        Wearing reading glasses, contacts.   Respiratory: Negative.   Cardiovascular: Negative.   Endocrine: Negative.   Genitourinary: Positive for menstrual problem (irregular periods).  Musculoskeletal: Negative.   Skin: Negative.   Allergic/Immunologic: Negative.   Neurological: Positive for headaches.  Hematological: Negative.   Psychiatric/Behavioral: Negative.        Objective:   Physical Exam Physical Exam  Constitutional: She is oriented to person, place, and time. She appears well-developed and well-nourished. No distress.  HENT:  Head: Normocephalic and atraumatic.  Right Ear: External ear normal. TM normal.  Left Ear: External ear  normal. TM normal.  Nose: Nose normal.  Mouth/Throat: Oropharynx is clear and moist. No oropharyngeal exudate.  Eyes: Conjunctivae are normal.   Neck: Normal range of motion. Neck supple. No JVD present. No thyromegaly present.  Cardiovascular: Normal rate, regular rhythm, normal heart sounds and intact distal pulses.   Pulmonary/Chest: Effort normal and breath sounds normal. Right breast exhibits no inverted nipple, no mass, no nipple discharge, no skin change and no tenderness. Left breast exhibits no inverted nipple, no mass, no nipple discharge, no skin change and no tenderness. Breasts are symmetrical.  Abdominal: Soft. Bowel sounds are normal. She exhibits no distension and no mass. There is no tenderness. There is no rebound and no guarding.  Genitourinary: Vagina normal. Pelvic exam was performed with patient supine. There is no rash, tenderness, lesion or injury on the right labia. There is no rash, tenderness, lesion or injury on the left labia. No vaginal discharge found. Slight rectocele palpable.  Musculoskeletal: Normal range of motion. She exhibits no edema or tenderness.  Lymphadenopathy:    She has no cervical adenopathy.  Neurological: She is alert and oriented to person, place, and time.   Skin: Skin is warm and dry. She is not diaphoretic.  Psychiatric: She has a normal mood and affect. Her behavior is normal. Judgment and thought content normal.  Vitals reviewed.     BP 118/72   Pulse 97   Temp 98.2 F (36.8 C) (Temporal)   Ht 5' 4.5" (1.638 m)   Wt 185 lb (83.9 kg)   SpO2 98%   BMI 31.26 kg/m  Wt Readings  from Last 3 Encounters:  05/23/20 185 lb (83.9 kg)  01/08/20 194 lb (88 kg)  05/22/19 180 lb (81.6 kg)       Assessment & Plan:  1. Annual physical exam - Discussed and encouraged healthy lifestyle choices- adequate sleep, regular exercise, stress management and healthy food choices.    2. Migraine with aura and without status migrainosus, not  intractable - advised blue blocking glasses for work, adequate fluid intake, discussed abortive measures - follow up in 3-4 months, sooner if worsening - SUMAtriptan (IMITREX) 50 MG tablet; Take 1 tablet (50 mg total) by mouth every 2 (two) hours as needed for migraine. May repeat in 2 hours if headache persists or recurs.No more than 2 tablets in 24 hours.  Dispense: 10 tablet; Refill: 1  3. Rectocele - small, she is interested in non surgical intervention first, will have her see pelvic floor PT - Ambulatory referral to Physical Therapy  4. Other constipation - increase water, fiber/ can try half dose Miralax - Ambulatory referral to Physical Therapy  This visit occurred during the SARS-CoV-2 public health emergency.  Safety protocols were in place, including screening questions prior to the visit, additional usage of staff PPE, and extensive cleaning of exam room while observing appropriate contact time as indicated for disinfecting solutions.    Clarene Reamer, FNP-BC  [facility] at [facility]  05/24/2020 6:54 AM

## 2020-05-23 NOTE — Patient Instructions (Addendum)
Good to see you today- keep up the good work with your diet! For headaches-  I have sent in sumatriptan, can take with two Alleve if you wake up with migraine Look into blue blocking glasses Hydrate well, can try over the counter magnesium- may help with sleep, bowel movements For bowel movements- try 1/2 dose of miralax every night, after 1 week, can increase to full dose  I have put in referral to pelvic floor pt, you should get a call in 1-2 weeks  Migraine Headache A migraine headache is a very strong throbbing pain on one side or both sides of your head. This type of headache can also cause other symptoms. It can last from 4 hours to 3 days. Talk with your doctor about what things may bring on (trigger) this condition. What are the causes? The exact cause of this condition is not known. This condition may be triggered or caused by:  Drinking alcohol.  Smoking.  Taking medicines, such as: ? Medicine used to treat chest pain (nitroglycerin). ? Birth control pills. ? Estrogen. ? Some blood pressure medicines.  Eating or drinking certain products.  Doing physical activity. Other things that may trigger a migraine headache include:  Having a menstrual period.  Pregnancy.  Hunger.  Stress.  Not getting enough sleep or getting too much sleep.  Weather changes.  Tiredness (fatigue). What increases the risk?  Being 69-47 years old.  Being female.  Having a family history of migraine headaches.  Being Caucasian.  Having depression or anxiety.  Being very overweight. What are the signs or symptoms?  A throbbing pain. This pain may: ? Happen in any area of the head, such as on one side or both sides. ? Make it hard to do daily activities. ? Get worse with physical activity. ? Get worse around bright lights or loud noises.  Other symptoms may include: ? Feeling sick to your stomach (nauseous). ? Vomiting. ? Dizziness. ? Being sensitive to bright lights, loud  noises, or smells.  Before you get a migraine headache, you may get warning signs (an aura). An aura may include: ? Seeing flashing lights or having blind spots. ? Seeing bright spots, halos, or zigzag lines. ? Having tunnel vision or blurred vision. ? Having numbness or a tingling feeling. ? Having trouble talking. ? Having weak muscles.  Some people have symptoms after a migraine headache (postdromal phase), such as: ? Tiredness. ? Trouble thinking (concentrating). How is this treated?  Taking medicines that: ? Relieve pain. ? Relieve the feeling of being sick to your stomach. ? Prevent migraine headaches.  Treatment may also include: ? Having acupuncture. ? Avoiding foods that bring on migraine headaches. ? Learning ways to control your body functions (biofeedback). ? Therapy to help you know and deal with negative thoughts (cognitive behavioral therapy). Follow these instructions at home: Medicines  Take over-the-counter and prescription medicines only as told by your doctor.  Ask your doctor if the medicine prescribed to you: ? Requires you to avoid driving or using heavy machinery. ? Can cause trouble pooping (constipation). You may need to take these steps to prevent or treat trouble pooping:  Drink enough fluid to keep your pee (urine) pale yellow.  Take over-the-counter or prescription medicines.  Eat foods that are high in fiber. These include beans, whole grains, and fresh fruits and vegetables.  Limit foods that are high in fat and sugar. These include fried or sweet foods. Lifestyle  Do not drink alcohol.  Do not use any products that contain nicotine or tobacco, such as cigarettes, e-cigarettes, and chewing tobacco. If you need help quitting, ask your doctor.  Get at least 8 hours of sleep every night.  Limit and deal with stress. General instructions      Keep a journal to find out what may bring on your migraine headaches. For example, write  down: ? What you eat and drink. ? How much sleep you get. ? Any change in what you eat or drink. ? Any change in your medicines.  If you have a migraine headache: ? Avoid things that make your symptoms worse, such as bright lights. ? It may help to lie down in a dark, quiet room. ? Do not drive or use heavy machinery. ? Ask your doctor what activities are safe for you.  Keep all follow-up visits as told by your doctor. This is important. Contact a doctor if:  You get a migraine headache that is different or worse than others you have had.  You have more than 15 headache days in one month. Get help right away if:  Your migraine headache gets very bad.  Your migraine headache lasts longer than 72 hours.  You have a fever.  You have a stiff neck.  You have trouble seeing.  Your muscles feel weak or like you cannot control them.  You start to lose your balance a lot.  You start to have trouble walking.  You pass out (faint).  You have a seizure. Summary  A migraine headache is a very strong throbbing pain on one side or both sides of your head. These headaches can also cause other symptoms.  This condition may be treated with medicines and changes to your lifestyle.  Keep a journal to find out what may bring on your migraine headaches.  Contact a doctor if you get a migraine headache that is different or worse than others you have had.  Contact your doctor if you have more than 15 headache days in a month. This information is not intended to replace advice given to you by your health care provider. Make sure you discuss any questions you have with your health care provider. Document Revised: 09/12/2018 Document Reviewed: 07/03/2018 Elsevier Patient Education  [place].

## 2020-05-24 ENCOUNTER — Encounter: Payer: Self-pay | Admitting: Family Medicine

## 2020-05-24 DIAGNOSIS — G43109 Migraine with aura, not intractable, without status migrainosus: Secondary | ICD-10-CM | POA: Insufficient documentation

## 2020-05-24 DIAGNOSIS — K5909 Other constipation: Secondary | ICD-10-CM | POA: Insufficient documentation

## 2020-05-24 DIAGNOSIS — N816 Rectocele: Secondary | ICD-10-CM | POA: Insufficient documentation

## 2020-05-24 HISTORY — DX: Other constipation: K59.09

## 2020-05-25 ENCOUNTER — Encounter: Payer: Commercial Managed Care - PPO | Admitting: Family Medicine

## 2021-02-01 IMAGING — MG MM DIGITAL DIAGNOSTIC UNILAT*L* W/ TOMO W/ CAD
6 series · 6 of 18 positions shown · non-contrast
Comparison: Previous exam(s).

CLINICAL DATA: 48-year-old female presenting for evaluation of a
palpable lump in the medial left breast.

EXAM:
DIGITAL DIAGNOSTIC LEFT MAMMOGRAM WITH CAD AND TOMO
ULTRASOUND LEFT BREAST

[L CC synth-2D (1 of 2)]
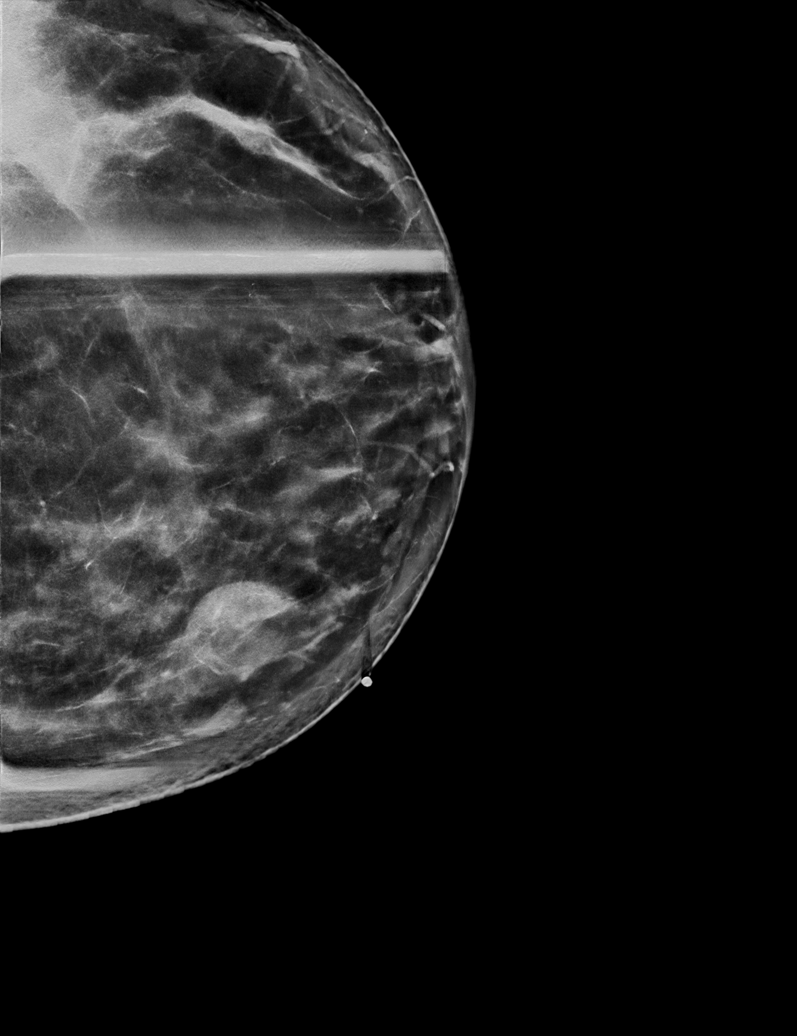

[L MLO synth-2D]
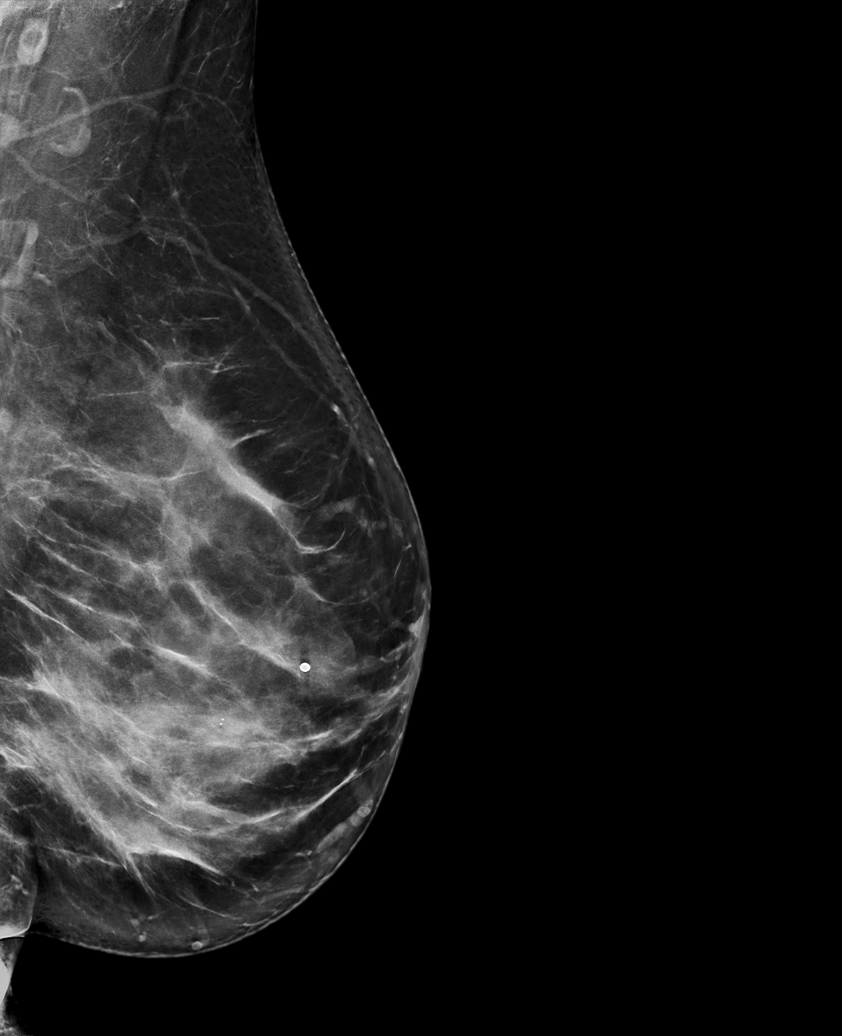

[L CC synth-2D (2 of 2)]
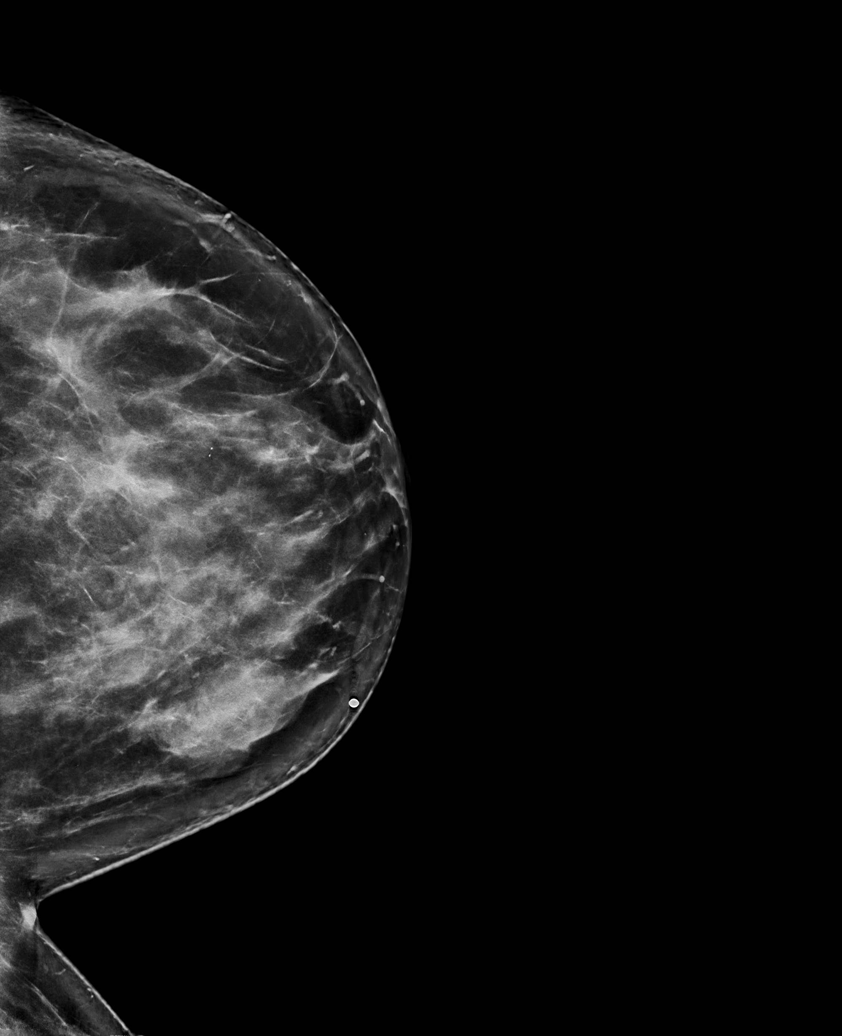

[L CC tomo (1 of 2) · tomo slice 48/95.0]
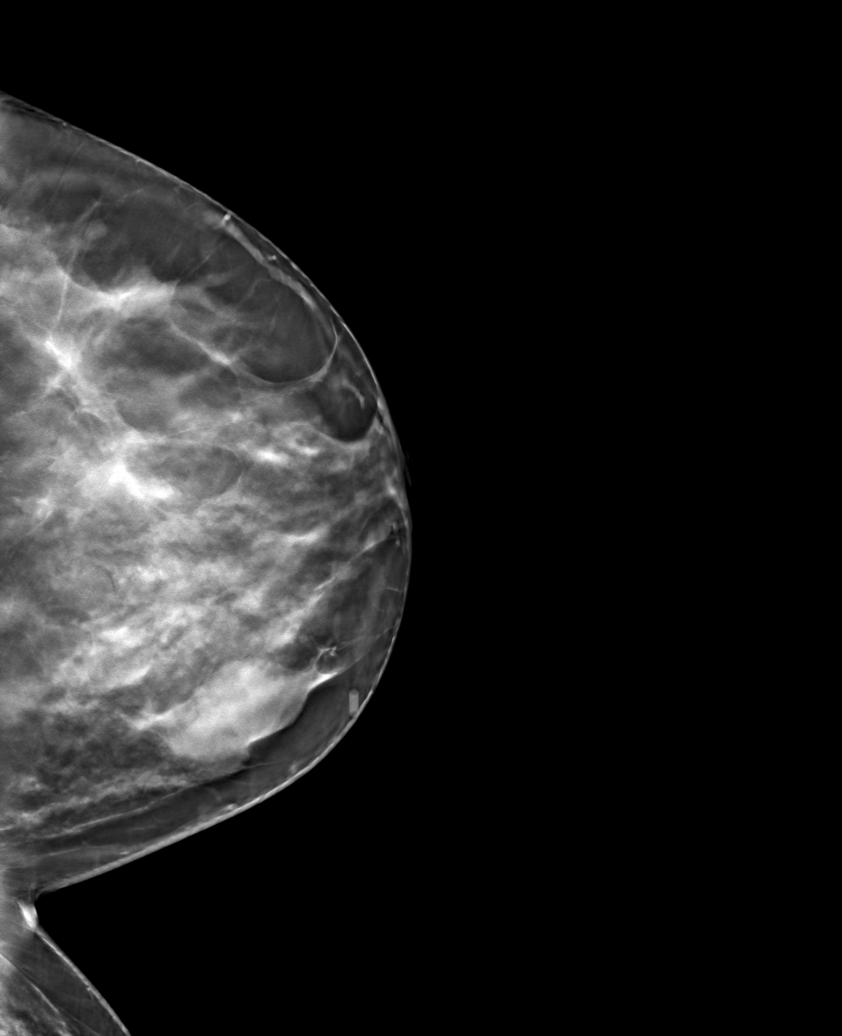

[L CC tomo (2 of 2) · tomo slice 49/98.0]
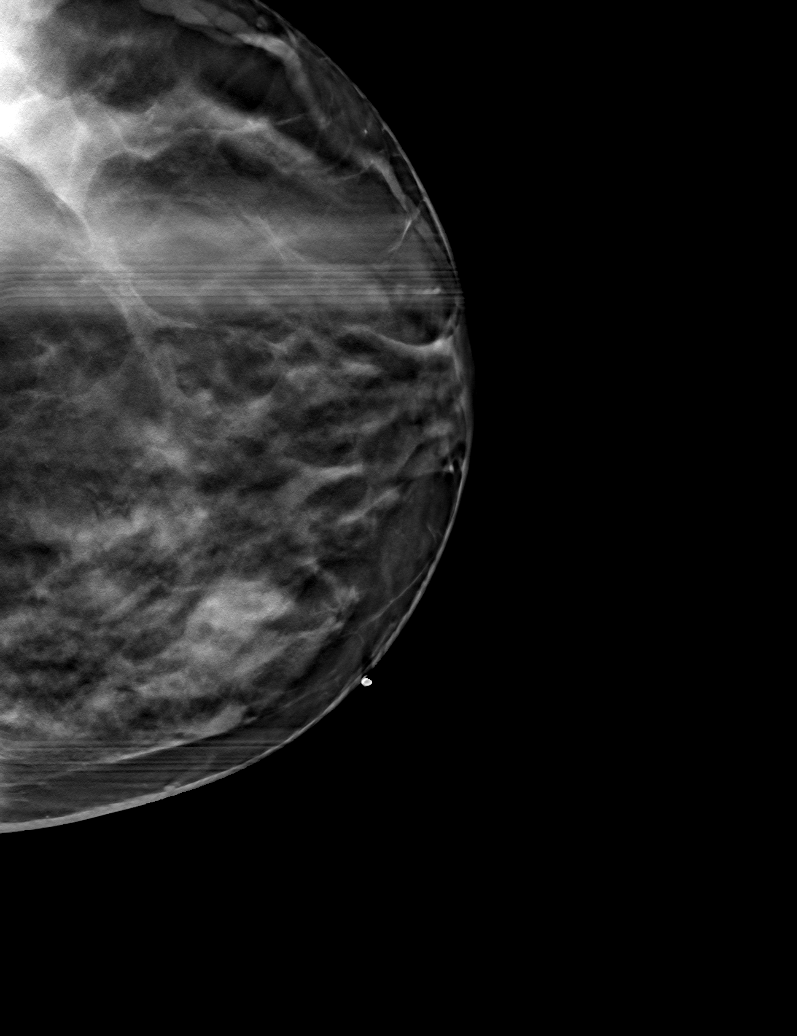

[L MLO tomo · tomo slice 45/89.0]
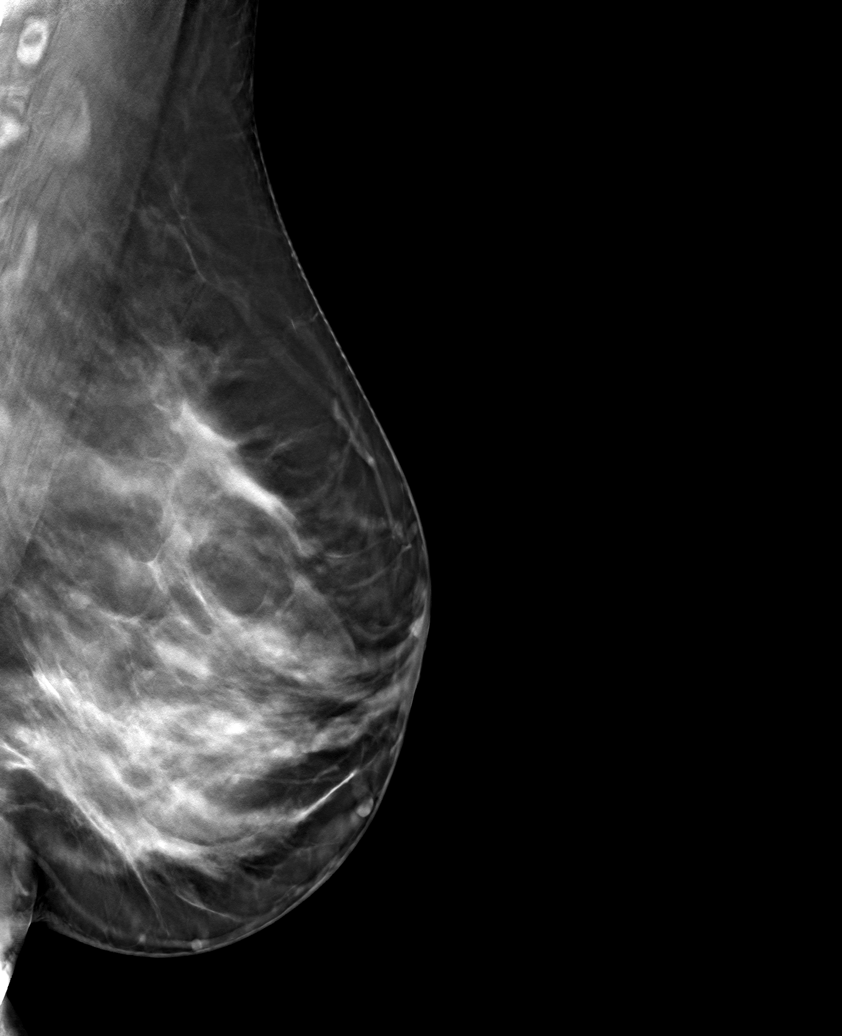

[6 of 18 positions shown; findings below may reference images not displayed]

ACR Breast Density Category c: The breast tissue is heterogeneously
dense, which may obscure small masses.
FINDINGS: A BB indicating the palpable site of concern has been placed on the
upper inner quadrant of the left breast. Deep to this marker there
is a mass which is both circumscribed and demonstrates indistinct
margins. This likely corresponds with the left breast mass seen on
the 07/01/2019 ultrasound corresponding with a large cyst.

Mammographic images were processed with CAD.

Ultrasound targeted to the left breast at 9 o'clock, 5 cm from the
nipple demonstrates a septated anechoic oval circumscribed mass
measuring 3.7 x 1.5 x 3.3 cm.
IMPRESSION: 1. The palpable mass in the left breast corresponds with a benign
cyst.

RECOMMENDATION:
1. The patient desires aspiration for symptomatic relief, which we
will schedule at her earliest convenience.

2. Return to routine screening mammography is recommended. The
patient will be due for screening in Thursday June, 2020.

I have discussed the findings and recommendations with the patient.
If applicable, a reminder letter will be sent to the patient
regarding the next appointment.

BI-RADS CATEGORY  2: Benign.

## 2021-05-30 ENCOUNTER — Encounter: Payer: Commercial Managed Care - PPO | Admitting: Internal Medicine

## 2021-08-03 ENCOUNTER — Encounter: Payer: Self-pay | Admitting: Family

## 2021-08-03 ENCOUNTER — Other Ambulatory Visit: Payer: Self-pay

## 2021-08-03 ENCOUNTER — Ambulatory Visit: Payer: Commercial Managed Care - PPO | Admitting: Family

## 2021-08-03 VITALS — BP 112/84 | HR 80 | Ht 65.0 in | Wt 197.0 lb

## 2021-08-03 DIAGNOSIS — Z1211 Encounter for screening for malignant neoplasm of colon: Secondary | ICD-10-CM

## 2021-08-03 DIAGNOSIS — R198 Other specified symptoms and signs involving the digestive system and abdomen: Secondary | ICD-10-CM | POA: Diagnosis not present

## 2021-08-03 DIAGNOSIS — N816 Rectocele: Secondary | ICD-10-CM | POA: Diagnosis not present

## 2021-08-03 DIAGNOSIS — L719 Rosacea, unspecified: Secondary | ICD-10-CM | POA: Insufficient documentation

## 2021-08-03 DIAGNOSIS — D508 Other iron deficiency anemias: Secondary | ICD-10-CM | POA: Diagnosis not present

## 2021-08-03 DIAGNOSIS — Z1231 Encounter for screening mammogram for malignant neoplasm of breast: Secondary | ICD-10-CM | POA: Insufficient documentation

## 2021-08-03 DIAGNOSIS — G43109 Migraine with aura, not intractable, without status migrainosus: Secondary | ICD-10-CM | POA: Diagnosis not present

## 2021-08-03 DIAGNOSIS — H10823 Rosacea conjunctivitis, bilateral: Secondary | ICD-10-CM | POA: Insufficient documentation

## 2021-08-03 DIAGNOSIS — L718 Other rosacea: Secondary | ICD-10-CM | POA: Insufficient documentation

## 2021-08-03 DIAGNOSIS — R3981 Functional urinary incontinence: Secondary | ICD-10-CM

## 2021-08-03 DIAGNOSIS — N898 Other specified noninflammatory disorders of vagina: Secondary | ICD-10-CM | POA: Insufficient documentation

## 2021-08-03 MED ORDER — IRON (FERROUS SULFATE) 325 (65 FE) MG PO TABS: 325.0000 mg | ORAL_TABLET | Freq: Every day | ORAL | 2 refills | Status: AC

## 2021-08-03 MED ORDER — RIZATRIPTAN BENZOATE 10 MG PO TABS: 10.0000 mg | ORAL_TABLET | ORAL | 0 refills | Status: DC | PRN

## 2021-08-03 MED ORDER — NA SULFATE-K SULFATE-MG SULF 17.5-3.13-1.6 GM/177ML PO SOLN: 1.0000 | Freq: Once | ORAL | 0 refills | Status: AC

## 2021-08-03 NOTE — Assessment & Plan Note (Signed)
Ordering urine culture pending results  

## 2021-08-03 NOTE — Progress Notes (Signed)
Gastroenterology Pre-Procedure Review ? ?Request Date: 08/31/2021 ?Requesting Physician: Dr. Marius Ditch ? ?PATIENT REVIEW QUESTIONS: The patient responded to the following health history questions as indicated:   ? ?1. Are you having any GI issues? no ?2. Do you have a personal history of Polyps? no ?3. Do you have a family history of Colon Cancer or Polyps? no ?4. Diabetes Mellitus? no ?5. Joint replacements in the past 12 months?no ?6. Major health problems in the past 3 months?yes (rectocele) ?7. Any artificial heart valves, MVP, or defibrillator?no ?   ?MEDICATIONS & ALLERGIES:    ?Patient reports the following regarding taking any anticoagulation/antiplatelet therapy:   ?Plavix, Coumadin, Eliquis, Xarelto, Lovenox, Pradaxa, Brilinta, or Effient? no ?Aspirin? no ? ?Patient confirms/reports the following medications:  ?Current Outpatient Medications  ?Medication Sig Dispense Refill  ? Biotin 1 MG CAPS Take by mouth.    ? Cholecalciferol (VITAMIN D3 GUMMIES) 25 MCG (1000 UT) CHEW Chew 2,000 Units by mouth.    ? Collagen-Vitamin C-Biotin (COLLAGEN 1500/C PO) Take by mouth.    ? esomeprazole (NEXIUM) 20 MG capsule Take 20 mg by mouth daily at 12 noon.    ? Iron, Ferrous Sulfate, 325 (65 Fe) MG TABS Take 325 mg by mouth daily. 30 tablet 2  ? metroNIDAZOLE (METROCREAM) 0.75 % cream     ? Multiple Vitamin (MULTIVITAMIN) tablet Take 1 tablet by mouth daily.    ? Probiotic Product (PROBIOTIC-10 PO) Take by mouth.    ? rizatriptan (MAXALT) 10 MG tablet Take 1 tablet (10 mg total) by mouth as needed for migraine. May repeat in 2 hours if needed 10 tablet 0  ? ?No current facility-administered medications for this visit.  ? ? ?Patient confirms/reports the following allergies:  ?Allergies  ?Allergen Reactions  ? Sumatriptan Other (See Comments)  ?  Made her feel weird  ? ? ?No orders of the defined types were placed in this encounter. ? ? ?AUTHORIZATION INFORMATION ?Primary Insurance: ?1D#: ?Group #: ? ?Secondary  Insurance: ?1D#: ?Group #: ? ?SCHEDULE INFORMATION: ?Date: 08/31/2021 ?Time: ?Location: [facility] ? ?

## 2021-08-03 NOTE — Assessment & Plan Note (Signed)
Referral placed to GI for colonoscopy.

## 2021-08-03 NOTE — Assessment & Plan Note (Signed)
Continue ferrous sulfate daily follow-up with GYN as she is currently following ?

## 2021-08-03 NOTE — Assessment & Plan Note (Signed)
Trial Maxalt try to identify and avoid triggers,  ?

## 2021-08-03 NOTE — Assessment & Plan Note (Signed)
Referred to urogyn for further examination avoid heavy straining ?

## 2021-08-03 NOTE — Progress Notes (Signed)
Established Patient Office Visit  Subjective:  Patient ID: Robin Benton, female    DOB: 31-Mar-1971  Age: 51 y.o. MRN: [id number]  CC:  Chief Complaint  Patient presents with   Transitions Of Care    Pt stated wall between rectum and vagina having constant pressure pain--2 years    HPI [name] is here for a transition of care visit.  Prior provider was: Marina Gravel, last seen CPE  Pt is with acute concerns:   Over the last two years with increased bowel/vaginal pressure she feels like it is between the wall of both. Bowel movements are a struggle, hard to push out. Constipation here and there, finds that she has to push her vaginal wall in manually to sometimes get out the bowel. She does have a confirmed h/o rectocele with physical exam two years ago.  Occasionally leaks urine over the last 6-8 months.  Some vaginal discharge, past few months as well. Usually clear sometimes a little yellow.  Dysuria a while ago, was treated virtually for UTI and improvement.  Does burn after sex.   Last pap 03/24/19  chronic concerns:  Migraine, with aura, with nausea. Didn't like sumatriptan when using in the past.  Occasional photosensitivity No sensitivity no numbness in extremities.  Seems to think they do occur during her menstrual cycles  Rectocele: Patient states in the past she was to move forward with physical therapy however she was not able to do this has had no real improvement things only worse.  Past Medical History:  Diagnosis Date   Basal cell adenocarcinoma    Basal cell carcinoma 10/25/2017   Formatting of this note might be different from the original. glabella   GERD (gastroesophageal reflux disease)    Hyperlipidemia    Other constipation 05/24/2020   Rosacea     History reviewed. No pertinent surgical history.  Family History  Problem Relation Age of Onset   Hyperlipidemia Mother    Depression Father    Supraventricular tachycardia Father    Hyperlipidemia  Father    Diabetes Maternal Grandmother    Cancer Paternal Grandfather    Breast cancer Neg Hx     Social History   Socioeconomic History   Marital status: Married    Spouse name: Not on file   Number of children: 2   Years of education: Not on file   Highest education level: Not on file  Occupational History   Occupation: Copywriter, advertising  Tobacco Use   Smoking status: Never   Smokeless tobacco: Never  Vaping Use   Vaping Use: Never used  Substance and Sexual Activity   Alcohol use: Not Currently    Alcohol/week: 2.0 standard drinks    Types: 2 Glasses of wine per week    Comment: 2-3 times a week   Drug use: Never   Sexual activity: Yes    Partners: Male    Comment: husband vasectomy  Other Topics Concern   Not on file  Social History Narrative   Not on file   Social Determinants of Health   Financial Resource Strain: Not on file  Food Insecurity: Not on file  Transportation Needs: Not on file  Physical Activity: Not on file  Stress: Not on file  Social Connections: Not on file  Intimate Partner Violence: Not on file    Outpatient Medications Prior to Visit  Medication Sig Dispense Refill   Biotin 1 MG CAPS Take by mouth.     Cholecalciferol (VITAMIN D3 GUMMIES)  25 MCG (1000 UT) CHEW Chew 2,000 Units by mouth.     Collagen-Vitamin C-Biotin (COLLAGEN 1500/C PO) Take by mouth.     esomeprazole (NEXIUM) 20 MG capsule Take 20 mg by mouth daily at 12 noon.     metroNIDAZOLE (METROCREAM) 0.75 % cream      Multiple Vitamin (MULTIVITAMIN) tablet Take 1 tablet by mouth daily.     Probiotic Product (PROBIOTIC-10 PO) Take by mouth.     Collagen-Vitamin C-Biotin 500-50-0.8 MG CAPS Take by mouth.     Misc Natural Products (AIRBORNE ELDERBERRY) CHEW Chew by mouth.     SUMAtriptan (IMITREX) 50 MG tablet Take 1 tablet (50 mg total) by mouth every 2 (two) hours as needed for migraine. May repeat in 2 hours if headache persists or recurs.No more than 2 tablets in 24 hours. 10  tablet 1   XIIDRA 5 % SOLN Place 1 drop into both eyes 2 (two) times daily.     No facility-administered medications prior to visit.    Allergies  Allergen Reactions   Sumatriptan Other (See Comments)    Made her feel weird    ROS Review of Systems  Constitutional:  Negative for chills and fatigue.  Respiratory:  Negative for cough and shortness of breath.   Cardiovascular:  Negative for chest pain and leg swelling.  Gastrointestinal:  Positive for constipation, nausea (at times) and rectal pain (pressure). Negative for diarrhea.  Genitourinary:  Positive for dyspareunia (after sex), dysuria (improved slightly), vaginal discharge (yellow to clear at times) and vaginal pain (pressure). Negative for decreased urine volume, difficulty urinating, flank pain, frequency, hematuria, menstrual problem, pelvic pain, urgency and vaginal bleeding.  Neurological:  Positive for headaches (at times not at current). Negative for dizziness, tremors, weakness, light-headedness and numbness.  Psychiatric/Behavioral:  Negative for agitation and sleep disturbance.   All other systems reviewed and are negative.     Objective:    Physical Exam Vitals reviewed.  Constitutional:      General: She is not in acute distress.    Appearance: Normal appearance. She is not ill-appearing or toxic-appearing.  HENT:     Mouth/Throat:     Mouth: Mucous membranes are moist.     Pharynx: No pharyngeal swelling.     Tonsils: No tonsillar exudate.  Neck:     Thyroid: No thyroid mass.  Cardiovascular:     Rate and Rhythm: Normal rate and regular rhythm.  Pulmonary:     Effort: Pulmonary effort is normal.     Breath sounds: Normal breath sounds.  Abdominal:     General: Abdomen is flat. Bowel sounds are normal.     Palpations: Abdomen is soft.  Genitourinary:    General: Normal vulva.     Labia:        Right: No rash, tenderness or lesion.        Left: No rash, tenderness or lesion.      Vagina: Vaginal  discharge (slight milky white discharge), tenderness (slight tenderness/pressure) and prolapsed vaginal walls (rectocele) present. No erythema, bleeding or lesions.     Cervix: No lesion or cervical bleeding.     Rectum: No mass, tenderness, anal fissure or internal hemorrhoid.  Musculoskeletal:        General: Normal range of motion.  Lymphadenopathy:     Cervical:     Right cervical: No superficial cervical adenopathy.    Left cervical: No superficial cervical adenopathy.  Skin:    General: Skin is warm.  Capillary Refill: Capillary refill takes less than 2 seconds.  Neurological:     General: No focal deficit present.     Mental Status: She is alert and oriented to person, place, and time.     Cranial Nerves: No cranial nerve deficit.     Sensory: No sensory deficit.     Motor: No weakness.     Gait: Gait normal.  Psychiatric:        Mood and Affect: Mood normal.        Behavior: Behavior normal.        Thought Content: Thought content normal.        Judgment: Judgment normal.   BP 112/84    Pulse 80    Ht 5\' 5"  (1.651 m)    Wt 197 lb (89.4 kg)    SpO2 97%    BMI 32.78 kg/m  Wt Readings from Last 3 Encounters:  08/03/21 197 lb (89.4 kg)  05/23/20 185 lb (83.9 kg)  01/08/20 194 lb (88 kg)     Health Maintenance Due  Topic Date Due   HIV Screening  Never done   Hepatitis C Screening  Never done   COLONOSCOPY (Pts 45-7yrs Insurance coverage will need to be confirmed)  Never done   COVID-19 Vaccine (3 - Booster for Lolita series) 01/09/2020   INFLUENZA VACCINE  Never done    There are no preventive care reminders to display for this patient.  Lab Results  Component Value Date   TSH 2.43 01/08/2020   Lab Results  Component Value Date   WBC 7.6 01/08/2020   HGB 13.3 01/08/2020   HCT 39.5 01/08/2020   MCV 89.3 01/08/2020   PLT 408.0 (H) 01/08/2020   Lab Results  Component Value Date   NA 137 01/08/2020   K 4.8 01/08/2020   CO2 29 01/08/2020   GLUCOSE 82  01/08/2020   BUN 10 01/08/2020   CREATININE 0.82 01/08/2020   BILITOT 0.5 01/08/2020   ALKPHOS 66 01/08/2020   AST 13 01/08/2020   ALT 11 01/08/2020   PROT 6.7 01/08/2020   ALBUMIN 4.3 01/08/2020   CALCIUM 9.6 01/08/2020   GFR 74.75 01/08/2020   Lab Results  Component Value Date   CHOL 229 (H) 01/08/2020   Lab Results  Component Value Date   HDL 37.30 (L) 01/08/2020   Lab Results  Component Value Date   LDLCALC 161 (H) 01/08/2020   Lab Results  Component Value Date   TRIG 156.0 (H) 01/08/2020   Lab Results  Component Value Date   CHOLHDL 6 01/08/2020   No results found for: HGBA1C    Assessment & Plan:   Problem List Items Addressed This Visit       Cardiovascular and Mediastinum   Migraine with aura and without status migrainosus, not intractable    Trial Maxalt try to identify and avoid triggers,       Relevant Medications   rizatriptan (MAXALT) 10 MG tablet     Digestive   Rectocele    Referred to urogyn for further examination avoid heavy straining      Relevant Orders   Ambulatory referral to Urogynecology     Other   Vaginal discharge    Wet prep ordered pending results      Relevant Orders   WET PREP BY MOLECULAR PROBE   Encounter for screening mammogram for malignant neoplasm of breast   Relevant Orders   MM 3D SCREEN BREAST BILATERAL   Screening for colon  cancer    Referral placed to GI for colonoscopy      Relevant Orders   Ambulatory referral to Gastroenterology   Functional urinary incontinence    Ordering urine culture pending results      Relevant Orders   Ambulatory referral to Urogynecology   Urine Culture   Rectal pressure    Suspected rectocele,  referring patient to urogyn Avoid heavy straining try to avoid constipation stool softener if needed       Relevant Orders   Ambulatory referral to Urogynecology   Iron deficiency anemia secondary to inadequate dietary iron intake - Primary    Continue ferrous sulfate  daily follow-up with GYN as she is currently following      Relevant Medications   Iron, Ferrous Sulfate, 325 (65 Fe) MG TABS    Meds ordered this encounter  Medications   Iron, Ferrous Sulfate, 325 (65 Fe) MG TABS    Sig: Take 325 mg by mouth daily.    Dispense:  30 tablet    Refill:  2    Order Specific Question:   Supervising Provider    Answer:   BEDSOLE, Meliya E [[id number]]   rizatriptan (MAXALT) 10 MG tablet    Sig: Take 1 tablet (10 mg total) by mouth as needed for migraine. May repeat in 2 hours if needed    Dispense:  10 tablet    Refill:  0    Order Specific Question:   Supervising Provider    Answer:   Diona Browner, Karalyne E [[id number]]    Follow-up: Return in about 3 months (around 11/03/2021) for schedule CPE , phyiscal exam come fasting for labs.    Eugenia Pancoast, FNP

## 2021-08-03 NOTE — Assessment & Plan Note (Signed)
Wet prep ordered pending results ?

## 2021-08-03 NOTE — Assessment & Plan Note (Signed)
Suspected rectocele,  referring patient to urogyn ?Avoid heavy straining try to avoid constipation stool softener if needed ? ?

## 2021-08-03 NOTE — Patient Instructions (Signed)
A referral was placed today for urogynecology for rectocele as well as GI for colonoscopy.  ?Please let us know if you have not heard back within 1 week about your referral. ? ?Urine culture today to rule out infection.  ? ?It was a pleasure seeing you today! Please do not hesitate to reach out with any questions and or concerns. ? ?Regards,  ? ?Pax Reasoner ?FNP-C ? ? ?

## 2021-08-04 LAB — URINE CULTURE
MICRO NUMBER:: 13079720
Result:: NO GROWTH
SPECIMEN QUALITY:: ADEQUATE

## 2021-08-04 NOTE — Progress Notes (Signed)
Any idea why this was cancelled?

## 2021-08-07 LAB — WET PREP BY MOLECULAR PROBE

## 2021-08-07 LAB — TEST AUTHORIZATION

## 2021-08-07 LAB — SURESWAB® ADVANCED BACTERIAL VAGINOSIS (BV), TMA

## 2021-08-08 ENCOUNTER — Telehealth: Payer: Self-pay | Admitting: *Deleted

## 2021-08-08 ENCOUNTER — Other Ambulatory Visit: Payer: Self-pay | Admitting: Family

## 2021-08-08 DIAGNOSIS — N898 Other specified noninflammatory disorders of vagina: Secondary | ICD-10-CM

## 2021-08-08 NOTE — Telephone Encounter (Signed)
Called [phone] regarding the Wet Prep. [facility]) --stated received the specimen on 08/03/2021-with container w/o swab,. [facility] cannot run the test w/o swab. ?

## 2021-08-09 ENCOUNTER — Other Ambulatory Visit: Payer: Self-pay

## 2021-08-09 ENCOUNTER — Encounter: Payer: Self-pay | Admitting: Obstetrics and Gynecology

## 2021-08-09 ENCOUNTER — Ambulatory Visit (INDEPENDENT_AMBULATORY_CARE_PROVIDER_SITE_OTHER): Payer: Commercial Managed Care - PPO | Admitting: Obstetrics and Gynecology

## 2021-08-09 VITALS — BP 107/71 | HR 74 | Ht 63.0 in | Wt 194.0 lb

## 2021-08-09 DIAGNOSIS — M62838 Other muscle spasm: Secondary | ICD-10-CM | POA: Diagnosis not present

## 2021-08-09 DIAGNOSIS — R350 Frequency of micturition: Secondary | ICD-10-CM | POA: Diagnosis not present

## 2021-08-09 DIAGNOSIS — N3941 Urge incontinence: Secondary | ICD-10-CM

## 2021-08-09 DIAGNOSIS — N393 Stress incontinence (female) (male): Secondary | ICD-10-CM | POA: Diagnosis not present

## 2021-08-09 DIAGNOSIS — N816 Rectocele: Secondary | ICD-10-CM

## 2021-08-09 LAB — POCT URINALYSIS DIPSTICK
Appearance: NORMAL
Bilirubin, UA: NEGATIVE
Blood, UA: NEGATIVE
Glucose, UA: NEGATIVE
Ketones, UA: NEGATIVE
Leukocytes, UA: NEGATIVE
Nitrite, UA: NEGATIVE
Protein, UA: NEGATIVE
Spec Grav, UA: >=1.03 — AB (ref 1.010–1.025)
Urobilinogen, UA: 0.2 E.U./dL
pH, UA: 6 (ref 5.0–8.0)

## 2021-08-09 NOTE — Progress Notes (Signed)
[facility] New Patient Evaluation and Consultation  Referring Provider: Eugenia Pancoast, FNP PCP: Eugenia Pancoast, FNP Date of Service: 08/09/2021  SUBJECTIVE Chief Complaint: New Patient (Initial Visit) (Robin Benton is a 51 y.o. female here for a rectocele evaluation)  History of Present Illness: Robin Benton is a 51 y.o. White or Caucasian female seen in consultation at the request of FNP Eugenia Pancoast for evaluation of pelvic pain and prolapse.    Review of records significant for: Having increased bowel and vaginal pressure and well as increased urinary leakage.  Urinary Symptoms: Leaks urine with cough/ sneeze, laughing, lifting, with a full bladder, and without sensation. Started the last 6-8 months. Notices her pad is wet and she does not know how it happened.  Leaks several time(s) per day.  Pad use: 3 liners/ mini-pads per day.   She is bothered by her UI symptoms.  Day time voids "many".  Nocturia: 1-2 times per night to void. Voiding dysfunction: she empties her bladder well.  does not use a catheter to empty bladder.  When urinating, she feels dribbling after finishing Drinks: 1 cup coffee, water. Occasional sweet tea  UTIs:  0  UTI's in the last year.   Denies history of blood in urine and kidney or bladder stones  Pelvic Organ Prolapse Symptoms:                  She Admits to a feeling of a bulge the vaginal area. It has been present for years, has been worsening the last few months. Has constant daily pressure and pain.  She Denies seeing a bulge.  This bulge is bothersome.  Bowel Symptom: Bowel movements: 1+ time(s) per day Stool consistency: hard Straining: yes Splinting: yes- doesn't come out unless she pushes  Incomplete evacuation: yes.  She Denies accidental bowel leakage / fecal incontinence Bowel regimen: none Last colonoscopy: has first scheduled this month  Sexual Function Sexually active: yes.  Sexual orientation:  heterosexual Pain with  sex: at the vaginal opening, has discomfort due to prolapse  Pelvic Pain Denies pelvic pain    Past Medical History:  Past Medical History:  Diagnosis Date   Basal cell adenocarcinoma    Basal cell carcinoma 10/25/2017   Formatting of this note might be different from the original. glabella   GERD (gastroesophageal reflux disease)    Hyperlipidemia    Other constipation 05/24/2020   Rosacea      Past Surgical History:   Past Surgical History:  Procedure Laterality Date   MOHS SURGERY       Past OB/GYN History: OB History  Gravida Para Term Preterm AB Living  '2 2 1 1   2  '$ SAB IAB Ectopic Multiple Live Births          2    # Outcome Date GA Lbr Len/2nd Weight Sex Delivery Anes PTL Lv  2 Term      Vag-Spont     1 Preterm    6 lb 1 oz (2.75 kg)  Vag-Spont      Perimenopausal LMP 08/03/21 Contraception: none. Last pap smear was 2021- negative.     Medications: She has a current medication list which includes the following prescription(s): biotin, vitamin d3 gummies, collagen-vitamin c-biotin, esomeprazole, iron (ferrous sulfate), metronidazole, multivitamin, probiotic product, and rizatriptan.   Allergies: Patient is allergic to sumatriptan.   Social History:  Social History   Tobacco Use   Smoking status: Never   Smokeless tobacco: Never  Vaping Use  Vaping Use: Never used  Substance Use Topics   Alcohol use: Not Currently    Alcohol/week: 2.0 standard drinks    Types: 2 Glasses of wine per week    Comment: 2-3 times a week   Drug use: Never    Relationship status: married She lives with husband and children.   She is employed as a Arboriculturist. Regular exercise: No History of abuse: No  Family History:   Family History  Problem Relation Age of Onset   Hyperlipidemia Mother    Depression Father    Supraventricular tachycardia Father    Hyperlipidemia Father    Diabetes Maternal Grandmother    Cancer Paternal Grandfather    Breast cancer Neg  Hx      Review of Systems: Review of Systems  Constitutional:  Negative for fever, malaise/fatigue and weight loss.  Respiratory:  Negative for cough, shortness of breath and wheezing.   Cardiovascular:  Negative for chest pain, palpitations and leg swelling.  Gastrointestinal:  Negative for abdominal pain and blood in stool.  Genitourinary:  Positive for dysuria.       + vaginal discharge and abnormal periods  Musculoskeletal:  Negative for myalgias.  Skin:  Negative for rash.  Neurological:  Negative for dizziness and headaches.  Endo/Heme/Allergies:  Does not bruise/bleed easily.  Psychiatric/Behavioral:  Negative for depression. The patient is not nervous/anxious.     OBJECTIVE Physical Exam: Vitals:   08/09/21 1031  BP: 107/71  Pulse: 74  Weight: 194 lb (88 kg)  Height: '5\' 3"'$  (1.6 m)    Physical Exam Constitutional:      General: She is not in acute distress. Pulmonary:     Effort: Pulmonary effort is normal.  Abdominal:     General: There is no distension.     Palpations: Abdomen is soft.     Tenderness: There is no abdominal tenderness. There is no rebound.  Musculoskeletal:        General: No swelling. Normal range of motion.  Skin:    General: Skin is warm and dry.     Findings: No rash.  Neurological:     Mental Status: She is alert and oriented to person, place, and time.  Psychiatric:        Mood and Affect: Mood normal.        Behavior: Behavior normal.     GU / Detailed Urogynecologic Evaluation:  Pelvic Exam: Normal external female genitalia; Bartholin's and Skene's glands normal in appearance; urethral meatus normal in appearance, no urethral masses or discharge.   CST: negative  Speculum exam reveals normal vaginal mucosa with atrophy. Cervix normal appearance. Uterus normal single, nontender. Adnexa no mass, fullness, tenderness.     Pelvic floor strength II/V  Pelvic floor musculature: Right levator tender, Right obturator tender, Left  levator tender, Left obturator tender  POP-Q:   POP-Q  -2.5                                            Aa   -2.5                                           Ba  -7  C   3                                            Gh  3                                            Pb  8                                            tvl   -0.5                                            Ap  -0.5                                            Bp  -9                                              D     Rectal Exam:  Normal sphincter tone, small distal rectocele, enterocoele not present, no rectal masses, no sign of dyssynergia when asking the patient to bear down.  Post-Void Residual (PVR) by Bladder Scan: In order to evaluate bladder emptying, we discussed obtaining a postvoid residual and she agreed to this procedure.  Procedure: The ultrasound unit was placed on the patient's abdomen in the suprapubic region after the patient had voided. A PVR of 25 ml was obtained by bladder scan.  Laboratory Results: POC urine: negative   ASSESSMENT AND PLAN Ms. Boies is a 51 y.o. with:  1. Prolapse of posterior vaginal wall   2. Levator spasm   3. SUI (stress urinary incontinence, female)   4. Urinary frequency   5. Urge incontinence    Stage I anterior, Stage II posterior, Stage I apical prolapse - For treatment of pelvic organ prolapse, we discussed options for management including expectant management, conservative management, and surgical management, such as Kegels, a pessary, pelvic floor physical therapy, and specific surgical procedures. - She is interested in surgery- posterior repair. Handout provided.   2. Levator spasm - The origin of pelvic floor muscle spasm can be multifactorial, including primary, reactive to a different pain source, trauma, or even part of a centralized pain syndrome.Treatment options include pelvic floor physical therapy, local  (vaginal) or oral  muscle relaxants, pelvic muscle trigger point injections or centrally acting pain medications.   - Referral placed for pelvic PT. We discussed that fixing the prolapse will not fix this issue of muscle spasm/ pain  3. SUI - For treatment of stress urinary incontinence,  non-surgical options include expectant management, weight loss, physical therapy, as well as a pessary.  Surgical options include a midurethral sling,  and transurethral injection of a bulking agent. - Will have her undergo urodynamic testing to demonstrate leakage.  4. OAB - Sensation of wetness but unaware when leaking  - Suspect overactive bladder but will have her undergo urodynamic testing to further assess leakage.   [name], MD

## 2021-08-24 ENCOUNTER — Other Ambulatory Visit: Payer: Self-pay

## 2021-08-24 ENCOUNTER — Encounter: Payer: Self-pay | Admitting: Physical Therapy

## 2021-08-24 ENCOUNTER — Encounter: Payer: Commercial Managed Care - PPO | Attending: Obstetrics and Gynecology | Admitting: Physical Therapy

## 2021-08-24 DIAGNOSIS — M62838 Other muscle spasm: Secondary | ICD-10-CM | POA: Insufficient documentation

## 2021-08-24 DIAGNOSIS — M6281 Muscle weakness (generalized): Secondary | ICD-10-CM | POA: Diagnosis present

## 2021-08-24 DIAGNOSIS — R278 Other lack of coordination: Secondary | ICD-10-CM | POA: Insufficient documentation

## 2021-08-24 NOTE — Therapy (Addendum)
[facility] ?[address], [address] ?[place], [place], [address] ?Phone: [phone]   Fax:  [phone] ? ?Patient Details  ?Name: Robin Benton ?MRN: [id number] ?Date of Birth: Oct 13, 1970 ?Referring Provider:  Sherlene Shams ?Encounter Date: 08/24/2021 ? ?OUTPATIENT PHYSICAL THERAPY FEMALE PELVIC EVALUATION ? ? ?Patient Name: Robin Benton ?MRN: [id number] ?DOB:1971-04-23, 51 y.o., female ?Today's Date: 08/24/2021 ? ? PT End of Session - 08/24/21 1254   ? ? Visit Number 1   ? Date for PT Re-Evaluation 11/16/21   ? Authorization Type UMR   ? PT Start Time 1130   ? PT Stop Time 1220   ? PT Time Calculation (min) 50 min   ? Activity Tolerance Patient tolerated treatment well   ? Behavior During Therapy [facility] for tasks assessed/performed   ? ?  ?  ? ?  ? ? ?Past Medical History:  ?Diagnosis Date  ? Basal cell adenocarcinoma   ? Basal cell carcinoma 10/25/2017  ? Formatting of this note might be different from the original. glabella  ? GERD (gastroesophageal reflux disease)   ? Hyperlipidemia   ? Other constipation 05/24/2020  ? Rosacea   ? ?Past Surgical History:  ?Procedure Laterality Date  ? MOHS SURGERY    ? ?Patient Active Problem List  ? Diagnosis Date Noted  ? Rosacea 08/03/2021  ? Rosacea conjunctivitis of both eyes 08/03/2021  ? Vaginal discharge 08/03/2021  ? Encounter for screening mammogram for malignant neoplasm of breast 08/03/2021  ? Screening for colon cancer 08/03/2021  ? Functional urinary incontinence 08/03/2021  ? Rectal pressure 08/03/2021  ? Iron deficiency anemia secondary to inadequate dietary iron intake 08/03/2021  ? Migraine with aura and without status migrainosus, not intractable 05/24/2020  ? Rectocele 05/24/2020  ? Class 1 obesity due to excess calories without serious comorbidity with body mass index (BMI) of 32.0 to 32.9 in adult 01/09/2020  ? Vitamin D deficiency 01/09/2020  ? Abnormal menstrual periods 01/09/2020  ? Gastroesophageal reflux  disease 01/09/2020  ? ? ?PCP: Eugenia Pancoast, FNP ? ?REFERRING PROVIDER: Dr. Sherlene Shams ? ?REFERRING DIAG: C78.938 Levator spasm; N39.32 SUI; N39.41 Urge incontinence ? ?THERAPY DIAG:  ?Other lack of coordination ? ?Other muscle spasm ? ?Muscle weakness (generalized) ? ?ONSET DATE: 2020 and past 6-8 months has become worse ? ?SUBJECTIVE:                                                                                                                                                                                          ? ?SUBJECTIVE STATEMENT: ?I will be having bladder testing then repair the posterior wall. Needs to reach  up and push on the posteriror vaginal canal to have a bowel movement Patient has a constant pressure pain, Patient with initial insertion of penis. Needs PT to help with pain and muscles.  ?Fluid intake: Yes: water, sometimes sweet tea   ? ?Patient confirms identification and approves PT to assess pelvic floor and treatment Yes ? ? ?PAIN:  ?Are you having pain? Yes ?NPRS scale: 7/10 constant pressure pain, after sex is 9/10 ?Pain location: Vaginal ? ?Pain type: constant pressure pain, during initial penetration ?Pain description: constant  ? ?Aggravating factors: during intercourse, and after intercourse for several days ?Relieving factors: cold pack  ? ?PRECAUTIONS: Other: Basil cell carcinoma ? ?WEIGHT BEARING RESTRICTIONS  none ? ?FALLS:  ?Has patient fallen in last 6 months? No, Number of falls: 0 ? ?LIVING ENVIRONMENT: ?Lives with: lives with their family ?Lives in: House/apartment ? ?OCCUPATION: Part time as dental hygienist ? ?PLOF: Independent ? ?PATIENT GOALS reduce pain ? ? ?PERTINENT HISTORY:  ?She broke her pelvis in several areas when she was 51 years old.  ?Sexual abuse: No ? ?BOWEL MOVEMENT ?Pain with bowel movement: Yes,sometimes with straining ?Type of bowel movement:Type (Bristol Stool Scale) Type 2-3, Frequency daily 1x, Strain Yes, and Splinting places finger into the  vaginal and pressed into the posterior vaginal wall ?Fully empty rectum: Yes:   ?Leakage: No ?Pads: No ?Fiber supplement: Yes: Stool softener ? ?URINATION ?Pain with urination: No ?Fully empty bladder: Yes:   ?Stream: Strong ?Urgency: No ?Frequency: urinate every 2-3 hours  ?Leakage:  Yes and does not know when it is, sneeze, cough ?Pads: Yes: 1 all day and changes when she goes to the bathroom ? ?INTERCOURSE ?Pain with intercourse: Initial Penetration ?Ability to have vaginal penetration:  Yes:   ?Climax: no ?Marinoff Scale: 1/3 ? ?PREGNANCY ?Vaginal deliveries 2 ?Tearing Yes: second pregnancy tore with several stitiches ? ?PROLAPSE ?Rectocele and plan to have surgery to correct it ? ? ? ?OBJECTIVE:  ? ?DIAGNOSTIC FINDINGS:  ?Will be having a test on the bladder in the future ? ?PATIENT SURVEYS:  ?None at this time ? ?PFIQ-7 None at this time ? ?COGNITION: ? Overall cognitive status: Within functional limits for tasks assessed   ?  ?SENSATION: ? Light touch: Appears intact ? Proprioception: Appears intact ? ? ? ? ?POSTURE:  ?Keeps pelvis shifted to the left.  ? ? ?LE ROM: ? ?Passive ROM Right ?08/24/2021 Left ?08/24/2021  ?    ?    ?    ?    ?    ?Hip external rotation 40 50  ?LE MMT: ? ?MMT Right ?08/24/2021 Left ?08/24/2021  ?Hip flexion    ?Hip extension 3+/5 5/5  ?Hip abduction 4/5 4/5  ? ?PELVIC MMT: ?  ?MMT  ?08/24/2021  ?Vaginal 23/5  ?Internal Anal Sphincter 3/5  ?External Anal Sphincter 3/5  ?Puborectalis 1/5  ?Diastasis Recti none  ?(Blank rows = not tested) ? ?      PALPATION: ?  General  dryness along the external genitalia; patient was able to push out the therapist finger after tactile and verbal cues were given ? ?              External Perineal Exam no tenderness ?              ?              Internal Pelvic Floor decreased movement of the puborectalis, tightness in the anococcygeal ligament, tightness in the iliococcygeus; tightness in the  post. Vaginal canal ? ?TONE: ?Increased for levator  ani ? ?PROLAPSE: ?rectocele ? ?TODAY'S TREATMENT  ?EVAL completed ? ? ?PATIENT EDUCATION:  ?Education details: educated patient on vaginal moisturizers and lubricants, and how to toilet correctly ?Person educated: Patient ?Education method: Explanation, Demonstration, Tactile cues, Verbal cues, and Handouts ?Education comprehension: verbalized understanding, returned demonstration, verbal cues required, tactile cues required, and needs further education ? ? ?HOME EXERCISE PROGRAM: ?Handout on toileting, vaginal moisturizers and lubricants ? ?ASSESSMENT: ? ?CLINICAL IMPRESSION: ?Patient is a 51 y.o. female who was seen today for physical therapy evaluation and treatment for levator spasm, stress and urge incontinence. Patient reports her symptoms have been worse in the past 6 months. Patient reports vaginal pain initially with penile penetration at level 9/10 and constant pelvic pressure at level 7/10. Patient will have to place her finger into the vaginal canal and press posteriorly to have a bowel movement. She has to strain at times. Her stool is Type 2-3 having them 1 time daily. Patient wears a pad daily and changes it everytime she goes to the bathroom due to urinary leakage. She will leak with coughing, sneezing and randomly. She does not always feel her leakage. Vaginal strength his 3/5, puborectalis is 1/5, external and internal sphincter strength is 2/5. She has tightness in the anococcygeal ligament, levator ani, and posterior vaginal wall. Patient external genitalia was red due to dryness. Patient was able to push the therapist finger out of her rectum after verbal and tactile cues. Patient will benefit from skilled therapy to work on pelvic floor coordination of defecating and urinary leakage and reduce muscle spasms for penile penetration.  ? ? ?OBJECTIVE IMPAIRMENTS decreased activity tolerance, decreased coordination, decreased strength, increased fascial restrictions, increased muscle spasms, and  pain.  ? ?ACTIVITY LIMITATIONS  continence, toileting .  ? ?PERSONAL FACTORS Fitness are also affecting patient's functional outcome.  ? ? ?REHAB POTENTIAL: Excellent ? ?CLINICAL DECISION MAKING: Stable/uncomplicated ?

## 2021-08-24 NOTE — Patient Instructions (Signed)
Moisturizers ?They are used in the vagina to hydrate the mucous membrane that make up the vaginal canal. ?Designed to keep a more normal acid balance (ph) ?Once placed in the vagina, it will last between two to three days.  ?Use 2-3 times per week at bedtime  ?Ingredients to avoid is glycerin and fragrance, can increase chance of infection ?Should not be used just before sex due to causing irritation ?Most are gels administered either in a tampon-shaped applicator or as a vaginal suppository. They are non-hormonal. ? ? ?Types of Moisturizers(internal use) ? ?Vitamin E vaginal suppositories- Whole foods, Amazon ?Moist Again ?Coconut oil- can break down condoms ?Julva- (Do no use if on Tamoxifen) amazon ?Yes moisturizer- amazon ?NeuEve Silk , NeuEve Silver for menopausal or over 65 (if have severe vaginal atrophy or cancer treatments use NeuEve Silk for  1 month than move to The Pepsi)- [employer], [place].com ?Olive and Bee intimate cream- www.oliveandbee.com.au ?Mae vaginal moisturizer- Amazon ?Aloe ? ? ? ?Creams to use externally on the Vulva area ?Desert Sara Lee (good for for cancer patients that had radiation to the area)- [place] or [employer] Corporation.FlyingBasics.com.br ?V-magic cream - amazon ?Julva-amazon ?Vital "V Wild Yam salve ( help moisturize and help with thinning vulvar area, does have Beeswax ?[place] ?Desert Conseco ?Cleo by Damiva labial moisturizer ([place],  ?Coconut or olive oil ?aloe ? ? ?Things to avoid in the vaginal area ?Do not use things to irritate the vulvar area ?No lotions just specialized creams for the vulva area- Neogyn, V-magic, No soaps; can use Aveeno or Calendula cleanser if needed. Must be gentle ?No deodorants ?No douches ?Good to sleep without underwear to let the vaginal area to air out ?No scrubbing: spread the lips to let warm water rinse over labias and pat dry ? ? ? ?Lubrication ?Used for intercourse to reduce friction ?Avoid ones that  have glycerin, nonoxynol-9, petroleum, propylene glycol, chlorhexidine gluconate, warming gels, tingling gels, icing or cooling gel, scented ?Avoid parabens due to a preservative similar to female sex hormone ?May need to be reapplied once or several times during sexual activity ?Can be applied to both partners genitals prior to vaginal penetration to minimize friction or irritation ?Prevent irritation and mucosal tears that cause post coital pain and increased the risk of vaginal and urinary tract infections ?Oil-based lubricants cannot be used with condoms due to breaking them down.  Least likely to irritate vaginal tissue.  ?Plant based-lubes are safe ?Silicone-based lubrication are thicker and last long and used for post-menopausal women ? ?Vaginal Lubricators ?Here is a list of some suggested lubricators you can use for intercourse. Use the most hypoallergenic product.  You can place on you or your partner.  ?Slippery Stuff ( water based) ?Sylk or Sliquid Natural H2O ( good  if frequent UTI?s)- walmart, amazon ?Sliquid organics silk-(aloe and silicone based ) ?Blossom Organics (www.blossom-organics.com)- (aloe based ) ?Coconut oil, olive oil -not good with condoms  ?PJur Woman Nude- (water based) amazon ?Uberlube- ( silicon) Amazon ?Aloe Vera- Sprouts has an organic one ?Yes lubricant- (water based and has plant oil based similar to silicone) Amazon ?Wet Platinum-Silicone, Target, Walgreens ?Olive and Bee intimate cream-  www.oliveandbee.com.au ?[place] ?Wet stuff ?Erosense Sync- walmart, amazon ?Coconu- FailLinks.co.uk ? ?Things to avoid in lubricants are glycerin, warming gels, tingling gels, icing or cooling ? gels, and scented gels.  Also avoid Vaseline. ?KY jelly, Replens, and Astroglide contain chlorhexidine which kills good bacteria(lactobacilli) ? ?Things to avoid in  the vaginal area ?Do not use things to irritate the vulvar area ?No lotions- see below ?Soaps you  can use :Aveeno, Calendula, Good Clean  Love cleanser if needed. Must be gentle ?No deodorants ?No douches ?Good to sleep without underwear to let the vaginal area to air out ?No scrubbing: spread the lips to let warm water rinse over labias and pat dry ? ?Creams that can be used on the Vulva Area ?V magic-amazon, walmart ?North ?Julva- Amazon ?MoonMaid Botanical Pro-[place]eno Wild Yam Cream ?Coconut oil, olive oil ?Cleo by Damiva labial moisturizer -[place],  ?Desert Havest Releveum ( lidocaine) or Desert Conseco ?Yes Moisturizer ? ?   ?Earlie Counts, PT ?[facility] ?[address], [address] ?[place], [place] [address] ?W: [phone] ?[email or web address] ? ?

## 2021-08-29 ENCOUNTER — Encounter: Payer: Self-pay | Admitting: Physical Therapy

## 2021-08-29 ENCOUNTER — Encounter: Payer: Commercial Managed Care - PPO | Admitting: Physical Therapy

## 2021-08-29 ENCOUNTER — Telehealth: Payer: Self-pay

## 2021-08-29 ENCOUNTER — Other Ambulatory Visit: Payer: Self-pay

## 2021-08-29 DIAGNOSIS — M62838 Other muscle spasm: Secondary | ICD-10-CM

## 2021-08-29 DIAGNOSIS — M6281 Muscle weakness (generalized): Secondary | ICD-10-CM

## 2021-08-29 DIAGNOSIS — R278 Other lack of coordination: Secondary | ICD-10-CM | POA: Diagnosis not present

## 2021-08-29 NOTE — Telephone Encounter (Signed)
Patient left voicemail because she called last week to cancel her colonoscopy on 08/31/21 and no one called her back. Called patient back and informed patient that we will cancel the colonoscopy she states she is having other health issues she wants to take care of before this. She will call back to reschedule. Called Trish to cancel colonoscopy  ?

## 2021-08-29 NOTE — Therapy (Addendum)
[facility] at [facility] [address], [place], [place], [address] Phone: [phone]   Fax:  [phone]  Patient Details  Name: Robin Benton MRN: [id number] Date of Birth: [birth year] Referring Provider:  [name], *  Encounter Date: 08/29/2021  OUTPATIENT PHYSICAL THERAPY TREATMENT NOTE   Patient Name: Robin Benton MRN: [id number] DOB:[birth year], 51 y.o., female Today's Date: 08/29/2021  PCP: Eugenia Pancoast, FNP REFERRING PROVIDER: [name], *   PT End of Session - 08/29/21 1304     Visit Number 2    Date for PT Re-Evaluation 11/16/21    Authorization Type UMR    Authorization - Visit Number 2    Authorization - Number of Visits 25    PT Start Time 1400    PT Stop Time 1445    PT Time Calculation (min) 45 min    Activity Tolerance Patient tolerated treatment well    Behavior During Therapy [facility] for tasks assessed/performed             Past Medical History:  Diagnosis Date   Basal cell adenocarcinoma    Basal cell carcinoma 10/25/2017   Formatting of this note might be different from the original. glabella   GERD (gastroesophageal reflux disease)    Hyperlipidemia    Other constipation 05/24/2020   Rosacea    Past Surgical History:  Procedure Laterality Date   MOHS SURGERY     Patient Active Problem List   Diagnosis Date Noted   Rosacea 08/03/2021   Rosacea conjunctivitis of both eyes 08/03/2021   Vaginal discharge 08/03/2021   Encounter for screening mammogram for malignant neoplasm of breast 08/03/2021   Screening for colon cancer 08/03/2021   Functional urinary incontinence 08/03/2021   Rectal pressure 08/03/2021   Iron deficiency anemia secondary to inadequate dietary iron intake 08/03/2021   Migraine with aura and without status migrainosus, not intractable 05/24/2020   Rectocele 05/24/2020   Class 1 obesity due to excess calories without serious comorbidity with body mass index  (BMI) of 32.0 to 32.9 in adult 01/09/2020   Vitamin D deficiency 01/09/2020   Abnormal menstrual periods 01/09/2020   Gastroesophageal reflux disease 01/09/2020    REFERRING DIAG: D40.814 Levator spasm; N39.32 SUI; N39.41 Urge incontinence  THERAPY DIAG:  Other lack of coordination  Other muscle spasm  Muscle weakness (generalized)  PERTINENT HISTORY: Basil cell carcinoma  PRECAUTIONS: Other: Basil cell carcinoma  SUBJECTIVE: No pain this past week. The stool softner helps.   PAIN:  Are you having pain? No   OBJECTIVE:   DIAGNOSTIC FINDINGS:  Will be having a test on the bladder in the future  PATIENT SURVEYS:  None at this time  PFIQ-7 None at this time  COGNITION:  Overall cognitive status: Within functional limits for tasks assessed     SENSATION:  Light touch: Appears intact  Proprioception: Appears intact     POSTURE:  Keeps pelvis shifted to the left.    LE ROM:  Passive ROM Right 08/24/2021 Left 08/24/2021  Hip external rotation 40 50  LE MMT:  MMT Right 08/24/2021 Left 08/24/2021  Hip extension 3+/5 5/5  Hip abduction 4/5 4/5   PELVIC MMT:   MMT  08/24/2021  Vaginal 23/5  Internal Anal Sphincter 3/5  External Anal Sphincter 3/5  Puborectalis 1/5  Diastasis Recti none  (Blank rows = not tested)        PALPATION:   General  dryness along the external genitalia; patient was able to  push out the therapist finger after tactile and verbal cues were given                External Perineal Exam no tenderness                             Internal Pelvic Floor decreased movement of the puborectalis, tightness in the anococcygeal ligament, tightness in the iliococcygeus; tightness in the post. Vaginal canal  TONE: Increased for levator ani  PROLAPSE: rectocele  TODAY'S TREATMENT   08/29/2021:  Manual; Using the suction cup to the abdomen to release the fascial especially superior and suprapubically, tissue rolling of the left lower rib  cage, release of the mesenteric root with arm movements and breath, manual work on the diaphragm in sidely, and quadratus.  Neuromuscular: sidely breathing into the lateral rib cage then breath out with upper and lower abdominal contraction on bilateral sides, squat with holding onto the door jam and breath into the lower rib cage and not lift the chest; Quadruped transverse abdominus contraction with breath; Lunge with ipsilateral trunk rotation with breath to open up the rib cage.  TA: instruction on correct toileting to open up the anus with diaphragmatic breathing and blow out with deep noise, verbal cues to relax the shoulders. Educated patient to reverse her thoracic rotation after treating a patient to reduce the repetitive motion of her trunk.  EVAL completed   PATIENT EDUCATION:  08/29/2021 PATIENT EDUCATION: Education details: Access Code: [email or web address]; toileting, posture with work an Insurance account manager turn the other way  Person educated: Patient Education method: Explanation, Demonstration, Corporate treasurer cues, Verbal cues, and Handouts Education comprehension: verbalized understanding, returned demonstration, verbal cues required, and tactile cues required  Eval; Education details: educated patient on vaginal moisturizers and lubricants, and how to toilet correctly Person educated: Patient Education method: Explanation, Demonstration, Tactile cues, Verbal cues, and Handouts Education comprehension: verbalized understanding, returned demonstration, verbal cues required, tactile cues required, and needs further education   HOME EXERCISE PROGRAM: Access Code: [email or web address] URL: [email or web address] Date: 08/29/2021 Prepared by: Earlie Counts  Exercises - Squat with TRX  - 1 x daily - 7 x weekly - 1 sets - 2 reps - 30 sec hold - Sidelying Transversus Abdominis Bracing  - 1 x daily - 7 x weekly - 1 sets - 10 reps - Supine Abdominal Wall Massage  - 1 x daily - 7 x weekly - 1 sets - 10 reps - World's  Greatest Stretch - Lunge with Ipsilateral Thoracic Rotation  - 1 x daily - 7 x weekly - 1 sets - 2 reps - 5 sec hold ASSESSMENT:  CLINICAL IMPRESSION: Patient is a 51 y.o. female who was seen today for physical therapy  treatment for levator spasm, stress and urge incontinence. Patient has been using the vaginal moisturizer and feels a difference. She has fascial restrictions in the thoracic, upper abdominals, suprapubically. She was able to expand her abdomin with diaphragmatic breathing after the manual work. She still has difficulty with opening her lower back rib cage. Patient was able to urinate with greater ease after her therapy. Patient will benefit from skilled therapy to work on pelvic floor coordination of defecating and urinary leakage and reduce muscle spasms for penile penetration.    OBJECTIVE IMPAIRMENTS decreased activity tolerance, decreased coordination, decreased strength, increased fascial restrictions, increased muscle spasms, and pain.   ACTIVITY LIMITATIONS continence, toileting.   PERSONAL FACTORS Fitness are also affecting patient's functional outcome.  REHAB POTENTIAL: Excellent  CLINICAL DECISION MAKING: Stable/uncomplicated  EVALUATION COMPLEXITY: Low Earlie Counts, PT 08/28/21 9:46 AM    GOALS: Goals reviewed with patient? Yes  SHORT TERM GOALS: Target date: 09/21/2021  Patient educated on vaginal moisturizers, vaginal lubricants and health to reduce dryness and pain with penile penetration.  Goal status: INITIAL  2.  Patient is able to elongate the pelvic floor to help with toilteting.  Goal status: INITIAL  3.  Patient independent with initial HEP for hip stretches and diaphragmatic breathing.  Goal status: INITIAL  4.  Patient educated on ways to manage rectocele to reduce her pressure.  Goal status: INITIAL   LONG TERM GOALS: Target date: 11/16/2021  Patient independent with advanced HEP for core strength and pelvic floor strength.  Goal  status: INITIAL  2.  Patient understands how to toilet correctly and strains 50% less of the time.  Goal status: INITIAL  3.  Patient understands ways to promote healing after having surgery to correct rectocele.  Goal status: INITIAL  4.  Pelvic floor strength is >/= 3/5 to reduce leakage and keep tone prior to surgery.  Goal status: INITIAL   PLAN: PT FREQUENCY: 1x/week  PT DURATION: 12 weeks  PLANNED INTERVENTIONS: Therapeutic exercises, Therapeutic activity, Neuromuscular re-education, Patient/Family education, Joint mobilization, Dry Needling, Biofeedback, and Manual therapy  PLAN FOR NEXT SESSION: manual work to posterior vaginal canal, rectally work on anococcygeal ligament and puborectalis, ask about toileting, hip strength for hip adductors. Hip stretches, education on rectocele      Derisha Funderburke, PT 08/29/2021, 2:05 PM      Karin Griffith, PT 08/29/2021, 2:05 PM  [place] at [facility] [address], [place], [place], [address] Phone: [phone]   Fax:  [phone]  PHYSICAL THERAPY DISCHARGE SUMMARY  Visits from Start of Care: 2  Current functional level related to goals / functional outcomes: See above. Patient did not return since 08/29/2021.    Remaining deficits: See above.    Education / Equipment: HEP   Patient agrees to discharge. Patient goals were not met. Patient is being discharged due to not returning since the last visit. Thank you for the referral. Earlie Counts, PT 10/31/21 10:36 AM

## 2021-08-31 ENCOUNTER — Encounter: Admission: RE | Payer: Self-pay | Source: Home / Self Care

## 2021-08-31 ENCOUNTER — Ambulatory Visit
Admission: RE | Admit: 2021-08-31 | Payer: Commercial Managed Care - PPO | Source: Home / Self Care | Admitting: Gastroenterology

## 2021-08-31 SURGERY — COLONOSCOPY WITH PROPOFOL
Anesthesia: General

## 2021-09-07 ENCOUNTER — Encounter: Payer: Commercial Managed Care - PPO | Admitting: Physical Therapy

## 2021-09-19 ENCOUNTER — Encounter: Payer: Commercial Managed Care - PPO | Admitting: Physical Therapy

## 2021-09-22 ENCOUNTER — Ambulatory Visit
Admission: RE | Admit: 2021-09-22 | Discharge: 2021-09-22 | Disposition: A | Discharge status: Home / Self Care | Payer: Commercial Managed Care - PPO | Source: Ambulatory Visit | Attending: Family | Admitting: Family

## 2021-09-22 DIAGNOSIS — Z1231 Encounter for screening mammogram for malignant neoplasm of breast: Secondary | ICD-10-CM | POA: Insufficient documentation

## 2021-10-25 ENCOUNTER — Ambulatory Visit (INDEPENDENT_AMBULATORY_CARE_PROVIDER_SITE_OTHER): Payer: Commercial Managed Care - PPO | Admitting: Obstetrics and Gynecology

## 2021-10-25 VITALS — BP 116/77 | HR 73 | Ht 65.0 in | Wt 194.0 lb

## 2021-10-25 DIAGNOSIS — R350 Frequency of micturition: Secondary | ICD-10-CM

## 2021-10-25 LAB — POCT URINALYSIS DIPSTICK
Appearance: NORMAL
Bilirubin, UA: NEGATIVE
Blood, UA: NEGATIVE
Glucose, UA: NEGATIVE
Ketones, UA: NEGATIVE
Leukocytes, UA: NEGATIVE
Nitrite, UA: NEGATIVE
Protein, UA: NEGATIVE
Spec Grav, UA: 1.025 (ref 1.010–1.025)
Urobilinogen, UA: 0.2 E.U./dL
pH, UA: 6 (ref 5.0–8.0)

## 2021-10-25 NOTE — Patient Instructions (Signed)

## 2021-10-26 NOTE — Progress Notes (Signed)
[name] Urodynamics Procedure  Referring Physician: Eugenia Pancoast, FNP Date of Procedure: 10/25/2021  Robin Benton is a 51 y.o. female who presents for urodynamic evaluation. Indication(s) for study: mixed incontinence  Vital Signs: BP 116/77   Pulse 73   Ht '5\' 5"'$  (1.651 m)   Wt 194 lb (88 kg)   BMI 32.28 kg/m   Laboratory Results: A catheterized urine specimen revealed: POC urine: negative   Voiding Diary: Did not perform voiding diary.   Procedure Timeout:  The correct patient was verified and the correct procedure was verified. The patient was in the correct position and safety precautions were reviewed based on at the patient's history.  Urodynamic Procedure A 21F dual lumen urodynamics catheter was placed under sterile conditions into the patient's bladder. A 21F catheter was placed into the rectum in order to measure abdominal pressure. EMG patches were placed in the appropriate position.  All connections were confirmed and calibrations/adjusted made. Saline was instilled into the bladder through the dual lumen catheters.  Cough/valsalva pressures were measured periodically during filling.  Patient was allowed to void.  The bladder was then emptied of its residual.  UROFLOW: Revealed a Qmax of 15 mL/sec.  She voided 64 mL and had a residual of 215 mL.  It was a intermittent pattern and represented normal habits though interpretation limited due to low voided volume.  CMG: This was performed with sterile water in the sitting position at a fill rate of 30 mL/min.    First sensation of fullness was 164 mLs,  First urge was 180 mLs,  Strong urge was 284 mLs and  Capacity was 466 mLs  Stress incontinence was not demonstrated Highest negative Barrier CLPP was 115 cmH20 at 383 ml. Highest negative Barrier VLPP was 93 cmH20 at 383 ml.  Detrusor function was normal, with no phasic contractions seen.   Compliance:  normal. End fill detrusor pressure was  3.8cmH20.  Calculated compliance was 156m/cmH20  UPP: MUCP with barrier reduction was 80 cm of water.    MICTURITION STUDY: Voiding was performed with reduction using scopettes in the sitting position.  Pdet at Qmax was 24 cm of water.  Qmax was 21 mL/sec.  It was a interrupted pattern.  She voided 366 mL and had a residual of 100 mL.  It was a volitional void, sustained detrusor contraction was present and abdominal straining was present  EMG: This was performed with patches.  She had voluntary contractions, recruitment with fill was present and urethral sphincter was relaxed with void.  The details of the procedure with the study tracings have been scanned into EPIC.   Urodynamic Impression:  1. Sensation was normal; capacity was normal 2. Stress Incontinence was not demonstrated. 3. Detrusor Overactivity was not demonstrated 4. Emptying was normal with a borderline normal PVR (1090m, a sustained detrusor contraction present,  abdominal straining present, normal urethral sphincter activity on EMG.  Plan: - The patient will follow up  to discuss the findings and treatment options.

## 2021-10-31 ENCOUNTER — Ambulatory Visit: Payer: Commercial Managed Care - PPO | Admitting: Obstetrics and Gynecology

## 2021-10-31 ENCOUNTER — Encounter: Payer: Self-pay | Admitting: Obstetrics and Gynecology

## 2021-10-31 VITALS — BP 108/73 | HR 79 | Resp 12 | Ht 65.0 in | Wt 194.0 lb

## 2021-10-31 DIAGNOSIS — K5904 Chronic idiopathic constipation: Secondary | ICD-10-CM

## 2021-10-31 DIAGNOSIS — N816 Rectocele: Secondary | ICD-10-CM

## 2021-10-31 DIAGNOSIS — N3281 Overactive bladder: Secondary | ICD-10-CM | POA: Diagnosis not present

## 2021-10-31 MED ORDER — SOLIFENACIN SUCCINATE 5 MG PO TABS: 5.0000 mg | ORAL_TABLET | Freq: Every day | ORAL | 5 refills | Status: DC

## 2021-10-31 NOTE — Patient Instructions (Signed)

## 2021-10-31 NOTE — Progress Notes (Unsigned)
[facility] Return Visit  SUBJECTIVE  History of Present Illness: Robin Benton is a 51 y.o. female seen in follow-up after urodynamic testing  She is now taking a stool softener which has helped some with her constipation.   Urodynamic Impression:  1. Sensation was normal; capacity was normal 2. Stress Incontinence was not demonstrated. 3. Detrusor Overactivity was not demonstrated 4. Emptying was normal with a borderline normal PVR (134m), a sustained detrusor contraction present,  abdominal straining present, normal urethral sphincter activity on EMG.  Past Medical History: Patient  has a past medical history of Basal cell adenocarcinoma, Basal cell carcinoma (10/25/2017), GERD (gastroesophageal reflux disease), Hyperlipidemia, Other constipation (05/24/2020), and Rosacea.   Past Surgical History: She  has a past surgical history that includes Mohs surgery.   Medications: She has a current medication list which includes the following prescription(s): biotin, vitamin d3 gummies, collagen-vitamin c-biotin, esomeprazole, iron (ferrous sulfate), multivitamin, noritate, probiotic product, rizatriptan, and solifenacin.   Allergies: Patient is allergic to sumatriptan.   Social History: Patient  reports that she has never smoked. She has never used smokeless tobacco. She reports that she does not currently use alcohol after a past usage of about 2.0 standard drinks per week. She reports that she does not use drugs.      OBJECTIVE     Physical Exam: Vitals:   10/31/21 1130  BP: 108/73  Pulse: 79  Resp: 12  SpO2: 93%  Weight: 194 lb (88 kg)  Height: '5\' 5"'$  (1.651 m)   Gen: No apparent distress, A&O x 3.  Detailed Urogynecologic Evaluation:  Deferred. Prior exam showed:  POP-Q (08/09/20):    POP-Q   -2.5                                            Aa   -2.5                                           Ba   -7                                              C    3                                             Gh   3                                            Pb   8                                            tvl    -0.5  Ap   -0.5                                            Bp   -9                                              D        ASSESSMENT AND PLAN    Ms. Lanius is a 51 y.o. with:  1. Overactive bladder   2. Chronic idiopathic constipation   3. Prolapse of posterior vaginal wall     - We discussed that her urodynamic testing did not show stress incontinence. Although detrusor overactivity was not demonstrated, she may still have overactive bladder which was not detected on exam.  - Will start vesicare '5mg'$  daily. Reviewed side effects of dry mouth, dry eyes and constipation.  - For constipation, recommended starting daily miralax prior to the procedure.   Plan for surgery: Exam under anesthesia, posterior repair.   - We reviewed the patient's specific anatomic and functional findings, with the assistance of diagrams, and together finalized the above procedure. The planned surgical procedures were discussed along with the surgical risks outlined below, which were also provided on a detailed handout. Additional treatment options including expectant management, conservative management, medical management were discussed where appropriate.  We reviewed the benefits and risks of each treatment option.   General Surgical Risks: For all procedures, there are risks of bleeding, infection, damage to surrounding organs including but not limited to bowel, bladder, blood vessels, ureters and nerves, and need for further surgery if an injury were to occur. These risks are all low with minimally invasive surgery.   There are risks of numbness and weakness at any body site or buttock/rectal pain.  It is possible that baseline pain can be worsened by surgery, either with or without mesh. If surgery is vaginal, there  is also a low risk of possible conversion to laparoscopy or open abdominal incision where indicated. Very rare risks include blood transfusion, blood clot, heart attack, pneumonia, or death.   There is also a risk of short-term postoperative urinary retention with need to use a catheter. About half of patients need to go home from surgery with a catheter, which is then later removed in the office. The risk of long-term need for a catheter is very low. There is also a risk of worsening of overactive bladder.    Prolapse (with or without mesh): Risk factors for surgical failure  include things that put pressure on your pelvis and the surgical repair, including obesity, chronic cough, and heavy lifting or straining (including lifting children or adults, straining on the toilet, or lifting heavy objects such as furniture or anything weighing >25 lbs. Risks of recurrence is 20-30% with vaginal native tissue repair and a less than 10% with sacrocolpopexy with mesh.    - For preop Visit:  She is required to have a visit within 30 days of her surgery.   Today we reviewed pre-operative preparation, peri-operative expectations, and post-operative instructions/recovery.  She was provided with instructional handouts. She understands not to take aspirin (>'81mg'$ ) or NSAIDs 7 days prior to surgery. Prescriptions will be provided for: Oxycodone '5mg'$ , Ibuprofen '600mg'$ , Tylenol '500mg'$ , Miralax. These prescriptions will be  sent prior to surgery.  - Medical clearance: not required  - Anticoagulant use: No - Medicaid Hysterectomy form: n/a - Accepts blood transfusion: Yes - Expected length of stay: outpatient  Request sent for surgery scheduling.   [name], MD

## 2021-11-01 ENCOUNTER — Encounter: Payer: Self-pay | Admitting: Obstetrics and Gynecology

## 2021-12-11 ENCOUNTER — Encounter (HOSPITAL_BASED_OUTPATIENT_CLINIC_OR_DEPARTMENT_OTHER): Payer: Self-pay | Admitting: Obstetrics and Gynecology

## 2021-12-11 NOTE — Progress Notes (Signed)
Spoke w/ via phone for pre-op interview---patient Lab needs dos---- POCT urine pregnancy              Lab results------none COVID test -----patient states asymptomatic no test needed Arrive at -------0530 on Monday 12/25/21 NPO after MN NO Solid Food.  Clear liquids from MN until---0430 on 12/25/21 Med rec completed Medications to take morning of surgery ----- none Diabetic medication -----n/a Patient instructed no nail polish to be worn day of surgery Patient instructed to bring photo id and insurance card day of surgery Patient aware to have Driver (ride ) / caregiver    for 24 hours after surgery - Allecia Bells (husband) [phone] Patient Special Instructions -----none Pre-Op special Istructions -----none Patient verbalized understanding of instructions that were given at this phone interview. Patient denies shortness of breath, chest pain, fever, cough at this phone interview.  IB message sent via Epic to Dr. Wannetta Sender to request pre-op orders.  Lyndel Pleasure, RN

## 2021-12-13 ENCOUNTER — Telehealth: Payer: Commercial Managed Care - PPO | Admitting: Obstetrics and Gynecology

## 2021-12-13 DIAGNOSIS — K5904 Chronic idiopathic constipation: Secondary | ICD-10-CM

## 2021-12-13 DIAGNOSIS — Z01818 Encounter for other preprocedural examination: Secondary | ICD-10-CM

## 2021-12-13 DIAGNOSIS — N3281 Overactive bladder: Secondary | ICD-10-CM

## 2021-12-13 DIAGNOSIS — N816 Rectocele: Secondary | ICD-10-CM | POA: Diagnosis not present

## 2021-12-13 MED ORDER — ACETAMINOPHEN 500 MG PO TABS: 500.0000 mg | ORAL_TABLET | Freq: Four times a day (QID) | ORAL | 0 refills | Status: DC | PRN

## 2021-12-13 MED ORDER — POLYETHYLENE GLYCOL 3350 17 GM/SCOOP PO POWD: 17.0000 g | Freq: Every day | ORAL | 0 refills | Status: DC

## 2021-12-13 MED ORDER — OXYCODONE HCL 5 MG PO TABS: 5.0000 mg | ORAL_TABLET | ORAL | 0 refills | Status: DC | PRN

## 2021-12-13 MED ORDER — IBUPROFEN 600 MG PO TABS: 600.0000 mg | ORAL_TABLET | Freq: Four times a day (QID) | ORAL | 0 refills | Status: DC | PRN

## 2021-12-13 NOTE — Progress Notes (Signed)
[facility] Pre-Operative visit- Video visit  Patient location- home in [place] Provider location- [place] Patient consented to video visit and was the only person on the call.   Subjective Chief Complaint: Robin Benton presents for a preoperative encounter.   History of Present Illness: Robin Benton is a 51 y.o. female who presents for preoperative visit.  She is scheduled to undergo Exam under anesthesia, posterior repair  on 12/25/21.  Her symptoms include prolapse and incontinence, and she was was found to have Stage I anterior, Stage II posterior, Stage 0 apical prolapse.   Urodynamic Impression:  1. Sensation was normal; capacity was normal 2. Stress Incontinence was not demonstrated. 3. Detrusor Overactivity was not demonstrated 4. Emptying was normal with a borderline normal PVR (187m), a sustained detrusor contraction present,  abdominal straining present, normal urethral sphincter activity on EMG.  Past Medical History:  Diagnosis Date   Basal cell adenocarcinoma    facial area   Basal cell carcinoma 10/25/2017   Formatting of this note might be different from the original. glabella   GERD (gastroesophageal reflux disease)    Hyperlipidemia    Other constipation 05/24/2020   PONV (postoperative nausea and vomiting)    Rosacea      Past Surgical History:  Procedure Laterality Date   MOHS SURGERY      is allergic to sumatriptan.   Family History  Problem Relation Age of Onset   Hyperlipidemia Mother    Depression Father    Supraventricular tachycardia Father    Hyperlipidemia Father    Diabetes Maternal Grandmother    Cancer Paternal Grandfather    Breast cancer Neg Hx     Social History   Tobacco Use   Smoking status: Never   Smokeless tobacco: Never  Vaping Use   Vaping Use: Never used  Substance Use Topics   Alcohol use: Not Currently    Alcohol/week: 2.0 standard drinks of alcohol    Types: 2 Glasses of wine per week   Drug use: Never      Review of Systems was negative for a full 10 system review except as noted in the History of Present Illness.   Current Outpatient Medications:    Cholecalciferol (VITAMIN D3 GUMMIES) 25 MCG (1000 UT) CHEW, Chew 2,000 Units by mouth., Disp: , Rfl:    Collagen-Vitamin C-Biotin (COLLAGEN 1500/C PO), Take by mouth., Disp: , Rfl:    esomeprazole (NEXIUM) 20 MG capsule, Take 20 mg by mouth daily at 12 noon., Disp: , Rfl:    Iron, Ferrous Sulfate, 325 (65 Fe) MG TABS, Take 325 mg by mouth daily., Disp: 30 tablet, Rfl: 2   Multiple Vitamin (MULTIVITAMIN) tablet, Take 1 tablet by mouth daily., Disp: , Rfl:    NORITATE 1 % cream, Apply topically daily., Disp: , Rfl:    Probiotic Product (PROBIOTIC-10 PO), Take by mouth., Disp: , Rfl:    rizatriptan (MAXALT) 10 MG tablet, Take 1 tablet (10 mg total) by mouth as needed for migraine. May repeat in 2 hours if needed, Disp: 10 tablet, Rfl: 0   solifenacin (VESICARE) 5 MG tablet, Take 1 tablet (5 mg total) by mouth daily., Disp: 30 tablet, Rfl: 5   Objective   Previous Pelvic Exam showed: Speculum exam reveals normal vaginal mucosa with atrophy. Cervix normal appearance. Uterus normal single, nontender. Adnexa no mass, fullness, tenderness.   POP-Q (08/09/20):    POP-Q   -2.5  Aa   -2.5                                           Ba   -7                                              C    3                                            Gh   3                                            Pb   8                                            tvl    -0.5                                            Ap   -0.5                                            Bp   -9                                              D          Assessment/ Plan  Assessment: The patient is a 51 y.o. year old scheduled to undergo Exam under anesthesia, posterior repair . Verbal consent was obtained for these  procedures.  Plan: General Surgical Consent: The patient has previously been counseled on alternative treatments, and the decision by the patient and provider was to proceed with the procedure listed above.  For all procedures, there are risks of bleeding, infection, damage to surrounding organs including but not limited to bowel, bladder, blood vessels, ureters and nerves, and need for further surgery if an injury were to occur. These risks are all low with minimally invasive surgery.   There are risks of numbness and weakness at any body site or buttock/rectal pain.  It is possible that baseline pain can be worsened by surgery, either with or without mesh. If surgery is vaginal, there is also a low risk of possible conversion to laparoscopy or open abdominal incision where indicated. Very rare risks include blood transfusion, blood clot, heart attack, pneumonia, or death.   There is also a risk of short-term postoperative urinary retention with need to use a catheter. About half of patients need to go home from surgery with a catheter, which is then later removed in  the office. The risk of long-term need for a catheter is very low. There is also a risk of worsening of overactive bladder.     Prolapse (with or without mesh): Risk factors for surgical failure  include things that put pressure on your pelvis and the surgical repair, including obesity, chronic cough, and heavy lifting or straining (including lifting children or adults, straining on the toilet, or lifting heavy objects such as furniture or anything weighing >25 lbs. Risks of recurrence is 20-30% with vaginal native tissue repair and a less than 10% with sacrocolpopexy with mesh.    We discussed consent for blood products. Risks for blood transfusion include allergic reactions, other reactions that can affect different body organs and managed accordingly, transmission of infectious diseases such as HIV or Hepatitis. However, the blood is  screened. Patient consents for blood products.  Pre-operative instructions:  She was instructed to not take Aspirin/NSAIDs x 7days prior to surgery.  Antibiotic prophylaxis was ordered as indicated.  Catheter use: Patient will go home with foley if needed after post-operative voiding trial.  Post-operative instructions:  She was provided with specific post-operative instructions, including precautions and signs/symptoms for which we would recommend contacting us, in addition to daytime and after-hours contact phone numbers. This was provided on a handout.   Post-operative medications: Prescriptions for motrin, tylenol, miralax, and oxycodone were sent to her pharmacy. Discussed using ibuprofen and tylenol on a schedule to limit use of narcotics.   Laboratory testing:  No labs required  Preoperative clearance:  She does not require surgical clearance.    Post-operative follow-up:  A post-operative appointment will be made for 6 weeks from the date of surgery. If she needs a post-operative nurse visit for a voiding trial, that will be set up after she leaves the hospital.    Patient will call the clinic or use MyChart should anything change or any new issues arise.   [name], MD   Time spent: I spent 20 minutes dedicated to the care of this patient on the date of this encounter to include pre-visit review of records, face-to-face time with the patient and post visit documentation and ordering medication/ testing.

## 2021-12-13 NOTE — Patient Instructions (Addendum)
PRE-OPERATIVE INSTRUCTIONS  Your Surgery: Exam under anesthesia, posterior repair   Date: 12/25/21  Time: 7:30am, arrival time: 5:30am  Location: [facility] ([place], [place], [place] [address])   Do not take NSAIDS (ibuprofen, naproxen, advil, aleve, motrin, or >'81mg'$  aspirin) for 7 days prior to surgery. If you take a baby aspirin ('81mg'$ ), you can continue this up until surgery.   No eating or drinking after midnight on the day of your surgery (unless otherwise instructed by pre-op nurse). If you need to take medications in the morning, take with a small sip of water.   If labs were ordered for you, they will be done at your Pre-op appointment at the hospital.   Medications have been sent to your pharmacy (see your AVS for your list of medications). Please pick up these medications prior to your scheduled surgery so you have them available after. DO NOT take the medications until after your surgery unless otherwise instructed.    POST OPERATIVE INSTRUCTIONS  General Instructions Recovery (not bed rest) will last approximately 6 weeks Walking is encouraged, but refrain from strenuous exercise/ housework/ heavy lifting. No lifting >10lbs  Nothing in the vagina- NO intercourse, tampons or douching Bathing:  Do not submerge in water (NO swimming, bath, hot tub, etc) until after your postop visit. You can shower starting the day after surgery.  No driving until you are not taking narcotic pain medicine and until your pain is well enough controlled that you can slam on the breaks or make sudden movements if needed.   Taking your medications Please take your acetaminophen and ibuprofen on a schedule for the first 48 hours. Take '600mg'$  ibuprofen, then take '500mg'$  acetaminophen 3 hours later, then continue to alternate ibuprofen and acetaminophen. That way you are taking each type of medication every 6 hours. Take the prescribed narcotic (oxycodone, tramadol, etc) as needed, with  a maximum being every 4 hours.  Take a stool softener daily to keep your stools soft and preventing you from straining. If you have diarrhea, you decrease your stool softener. This is explained more below. We have prescribed you Miralax.  Reasons to Call the Nurse (see last page for phone numbers) Heavy Bleeding (changing your pad every 1-2 hours) Persistent nausea/vomiting Fever (100.4 degrees or more) Incision problems (pus or other fluid coming out, redness, warmth, increased pain)  Things to Expect After Surgery Mild to Moderate pain is normal during the first day or two after surgery. If prescribed, take Ibuprofen or Tylenol first and use the stronger medicine for "break-through" pain. You can overlap these medicines because they work differently.   Constipation   To Prevent Constipation:  Eat a well-balanced diet including protein, grains, fresh fruit and vegetables.  Drink plenty of fluids. Walk regularly.  Depending on specific instructions from your physician: take Miralax daily and additionally you can add a stool softener (colace/ docusate) and fiber supplement. Continue as long as you're on pain medications.   To Treat Constipation:  If you do not have a bowel movement in 2 days after surgery, you can take 2 Tbs of Milk of Magnesia 1-2 times a day until you have a bowel movement. If diarrhea occurs, decrease the amount or stop the laxative. If no results with Milk of Magnesia, you can drink a bottle of magnesium citrate which you can purchase over the counter.  Fatigue:  This is a normal response to surgery and will improve with time.  Plan frequent rest periods throughout  the day.  Gas Pain:  This is very common but can also be very painful! Drink warm liquids such as herbal teas, bouillon or soup. Walking will help you pass more gas.  Mylicon or Gas-X can be taken over the counter.  Leaking Urine:  Varying amounts of leakage may occur after surgery.  This should improve with time.  Your bladder needs at least 3 months to recover from surgery. If you leak after surgery, be sure to mention this to your doctor at your post-op visit. If you were taking medications for overactive bladder prior to surgery, be sure to restart the medications immediately after surgery.  Incisions: If you have incisions on your abdomen, the skin glue will dissolve on its own over time. It is ok to gently rinse with soap and water over these incisions but do not scrub.  Catheter Approximately 50% of patients are unable to urinate after surgery and need to go home with a catheter. This allows your bladder to rest so it can return to full function. If you go home with a catheter, the office will call to set up a voiding trial a few days after surgery. For most patients, by this visit, they are able to urinate on their own. Long term catheter use is rare.   Return to Work  As work demands and recovery times vary widely, it is hard to predict when you will want to return to work. If you have a desk job with no strenuous physical activity, and if you would like to return sooner than generally recommended, discuss this with your provider or call our office.   Post op concerns  For non-emergent issues, please call the Urogynecology Nurse. Please leave a message and someone will contact you within one business day.  You can also send a message through [facility].   AFTER HOURS (After 5:00 PM and on weekends):  For urgent matters that cannot wait until the next business day. Call our office [phone] and connect to the doctor on call.  Please reserve this for important issues.   **FOR ANY TRUE EMERGENCY ISSUES CALL 911 OR GO TO THE NEAREST EMERGENCY ROOM.** Please inform our office or the doctor on call of any emergency.     APPOINTMENTS: Call [phone]

## 2021-12-14 NOTE — H&P (Signed)
[facility] Pre-Operative H&P   Subjective Chief Complaint: Skyleigh Ergle presents for a preoperative encounter.   History of Present Illness: Tammela Rabelo is a 51 y.o. female who presents for preoperative visit.  She is scheduled to undergo Exam under anesthesia, posterior repair  on 12/25/21.  Her symptoms include prolapse and incontinence, and she was was found to have Stage I anterior, Stage II posterior, Stage 0 apical prolapse.   Urodynamic Impression:  1. Sensation was normal; capacity was normal 2. Stress Incontinence was not demonstrated. 3. Detrusor Overactivity was not demonstrated 4. Emptying was normal with a borderline normal PVR (13m), a sustained detrusor contraction present,  abdominal straining present, normal urethral sphincter activity on EMG.  Past Medical History:  Diagnosis Date   Basal cell adenocarcinoma    facial area   Basal cell carcinoma 10/25/2017   Formatting of this note might be different from the original. glabella   GERD (gastroesophageal reflux disease)    Hyperlipidemia    Other constipation 05/24/2020   PONV (postoperative nausea and vomiting)    Rosacea      Past Surgical History:  Procedure Laterality Date   MOHS SURGERY      is allergic to sumatriptan.   Family History  Problem Relation Age of Onset   Hyperlipidemia Mother    Depression Father    Supraventricular tachycardia Father    Hyperlipidemia Father    Diabetes Maternal Grandmother    Cancer Paternal Grandfather    Breast cancer Neg Hx     Social History   Tobacco Use   Smoking status: Never   Smokeless tobacco: Never  Vaping Use   Vaping Use: Never used  Substance Use Topics   Alcohol use: Not Currently    Alcohol/week: 2.0 standard drinks of alcohol    Types: 2 Glasses of wine per week   Drug use: Never     Review of Systems was negative for a full 10 system review except as noted in the History of Present Illness.  No current  facility-administered medications for this encounter.  Current Outpatient Medications:    Cholecalciferol (VITAMIN D3 GUMMIES) 25 MCG (1000 UT) CHEW, Chew 2,000 Units by mouth., Disp: , Rfl:    Collagen-Vitamin C-Biotin (COLLAGEN 1500/C PO), Take by mouth., Disp: , Rfl:    esomeprazole (NEXIUM) 20 MG capsule, Take 20 mg by mouth daily at 12 noon., Disp: , Rfl:    Iron, Ferrous Sulfate, 325 (65 Fe) MG TABS, Take 325 mg by mouth daily., Disp: 30 tablet, Rfl: 2   Multiple Vitamin (MULTIVITAMIN) tablet, Take 1 tablet by mouth daily., Disp: , Rfl:    NORITATE 1 % cream, Apply topically daily., Disp: , Rfl:    Probiotic Product (PROBIOTIC-10 PO), Take by mouth., Disp: , Rfl:    rizatriptan (MAXALT) 10 MG tablet, Take 1 tablet (10 mg total) by mouth as needed for migraine. May repeat in 2 hours if needed, Disp: 10 tablet, Rfl: 0   solifenacin (VESICARE) 5 MG tablet, Take 1 tablet (5 mg total) by mouth daily., Disp: 30 tablet, Rfl: 5   acetaminophen (TYLENOL) 500 MG tablet, Take 1 tablet (500 mg total) by mouth every 6 (six) hours as needed (pain)., Disp: 30 tablet, Rfl: 0   ibuprofen (ADVIL) 600 MG tablet, Take 1 tablet (600 mg total) by mouth every 6 (six) hours as needed., Disp: 30 tablet, Rfl: 0   oxyCODONE (OXY IR/ROXICODONE) 5 MG immediate release tablet, Take 1 tablet (5 mg total) by mouth  every 4 (four) hours as needed for severe pain., Disp: 5 tablet, Rfl: 0   polyethylene glycol powder (GLYCOLAX/MIRALAX) 17 GM/SCOOP powder, Take 17 g by mouth daily. Drink 17g (1 scoop) dissolved in water per day., Disp: 255 g, Rfl: 0   Objective   Previous Pelvic Exam showed: Speculum exam reveals normal vaginal mucosa with atrophy. Cervix normal appearance. Uterus normal single, nontender. Adnexa no mass, fullness, tenderness.   POP-Q (08/09/20):    POP-Q   -2.5                                            Aa   -2.5                                           Ba   -7                                               C    3                                            Gh   3                                            Pb   8                                            tvl    -0.5                                            Ap   -0.5                                            Bp   -9                                              D          Assessment/ Plan  The patient is a 51 y.o. year old with stage II Posterior vaginal prolapse scheduled to undergo Exam under anesthesia, posterior repair .   [name], MD

## 2021-12-24 NOTE — Anesthesia Preprocedure Evaluation (Addendum)
Anesthesia Evaluation  Patient identified by MRN, date of birth, ID band Patient awake    Reviewed: Allergy & Precautions, NPO status , Patient's Chart, lab work & pertinent test results  History of Anesthesia Complications (+) PONV and history of anesthetic complications (hx PONV w/ dental surgery but it was in her 34s. has not had GA since then)  Airway Mallampati: III  TM Distance: >3 FB Neck ROM: Full    Dental  (+) Teeth Intact, Dental Advisory Given   Pulmonary neg pulmonary ROS,    Pulmonary exam normal breath sounds clear to auscultation       Cardiovascular negative cardio ROS Normal cardiovascular exam Rhythm:Regular Rate:Normal     Neuro/Psych  Headaches, negative psych ROS   GI/Hepatic Neg liver ROS, GERD  Medicated and Controlled,  Endo/Other  negative endocrine ROS  Renal/GU negative Renal ROS  Female GU complaint     Musculoskeletal negative musculoskeletal ROS (+)   Abdominal   Peds  Hematology negative hematology ROS (+)   Anesthesia Other Findings   Reproductive/Obstetrics negative OB ROS                            Anesthesia Physical Anesthesia Plan  ASA: 2  Anesthesia Plan: General   Post-op Pain Management: Tylenol PO (pre-op)* and Toradol IV (intra-op)*   Induction: Intravenous  PONV Risk Score and Plan: Ondansetron, Dexamethasone, Midazolam, Treatment may vary due to age or medical condition and Scopolamine patch - Pre-op  Airway Management Planned: LMA  Additional Equipment: None  Intra-op Plan:   Post-operative Plan: Extubation in OR  Informed Consent: I have reviewed the patients History and Physical, chart, labs and discussed the procedure including the risks, benefits and alternatives for the proposed anesthesia with the patient or authorized representative who has indicated his/her understanding and acceptance.     Dental advisory  given  Plan Discussed with: CRNA  Anesthesia Plan Comments:       Anesthesia Quick Evaluation

## 2021-12-25 ENCOUNTER — Other Ambulatory Visit: Payer: Self-pay

## 2021-12-25 ENCOUNTER — Telehealth: Payer: Self-pay | Admitting: Obstetrics and Gynecology

## 2021-12-25 ENCOUNTER — Ambulatory Visit (HOSPITAL_BASED_OUTPATIENT_CLINIC_OR_DEPARTMENT_OTHER): Payer: Commercial Managed Care - PPO | Admitting: Anesthesiology

## 2021-12-25 ENCOUNTER — Encounter (HOSPITAL_BASED_OUTPATIENT_CLINIC_OR_DEPARTMENT_OTHER): Payer: Self-pay | Admitting: Obstetrics and Gynecology

## 2021-12-25 ENCOUNTER — Encounter (HOSPITAL_BASED_OUTPATIENT_CLINIC_OR_DEPARTMENT_OTHER)
Admission: RE | Disposition: A | Discharge status: Home / Self Care | Payer: Self-pay | Source: Home / Self Care | Attending: Obstetrics and Gynecology

## 2021-12-25 ENCOUNTER — Ambulatory Visit (HOSPITAL_BASED_OUTPATIENT_CLINIC_OR_DEPARTMENT_OTHER)
Admission: RE | Admit: 2021-12-25 | Discharge: 2021-12-25 | Disposition: A | Discharge status: Home / Self Care | Payer: Commercial Managed Care - PPO | Attending: Obstetrics and Gynecology | Admitting: Obstetrics and Gynecology

## 2021-12-25 DIAGNOSIS — N816 Rectocele: Secondary | ICD-10-CM | POA: Diagnosis not present

## 2021-12-25 DIAGNOSIS — K219 Gastro-esophageal reflux disease without esophagitis: Secondary | ICD-10-CM | POA: Diagnosis not present

## 2021-12-25 DIAGNOSIS — N926 Irregular menstruation, unspecified: Secondary | ICD-10-CM

## 2021-12-25 HISTORY — PX: RECTOCELE REPAIR: SHX761

## 2021-12-25 HISTORY — DX: Other specified postprocedural states: Z98.890

## 2021-12-25 HISTORY — DX: Other specified postprocedural states: R11.2

## 2021-12-25 LAB — POCT PREGNANCY, URINE: Preg Test, Ur: NEGATIVE

## 2021-12-25 SURGERY — COLPORRHAPHY, POSTERIOR, FOR RECTOCELE REPAIR
Anesthesia: General | Site: Vagina

## 2021-12-25 MED ORDER — EPHEDRINE SULFATE (PRESSORS) 50 MG/ML IJ SOLN
INTRAMUSCULAR | Status: DC | PRN
  Administered 2021-12-25 (×2): 10 mg via INTRAVENOUS

## 2021-12-25 MED ORDER — DEXAMETHASONE SODIUM PHOSPHATE 10 MG/ML IJ SOLN
INTRAMUSCULAR | Status: AC
  Filled 2021-12-25: qty 1

## 2021-12-25 MED ORDER — MEPERIDINE HCL 25 MG/ML IJ SOLN: 6.2500 mg | INTRAMUSCULAR | Status: DC | PRN

## 2021-12-25 MED ORDER — KETOROLAC TROMETHAMINE 30 MG/ML IJ SOLN: 30.0000 mg | Freq: Once | INTRAMUSCULAR | Status: DC | PRN

## 2021-12-25 MED ORDER — LACTATED RINGERS IV SOLN: INTRAVENOUS | Status: DC

## 2021-12-25 MED ORDER — LIDOCAINE HCL (CARDIAC) PF 100 MG/5ML IV SOSY
PREFILLED_SYRINGE | INTRAVENOUS | Status: DC | PRN
  Administered 2021-12-25: 60 mg via INTRAVENOUS

## 2021-12-25 MED ORDER — ONDANSETRON HCL 4 MG/2ML IJ SOLN
INTRAMUSCULAR | Status: AC
  Filled 2021-12-25: qty 2

## 2021-12-25 MED ORDER — SCOPOLAMINE 1 MG/3DAYS TD PT72
1.0000 | MEDICATED_PATCH | TRANSDERMAL | Status: DC
Start: 2021-12-25 — End: 2021-12-26
  Administered 2021-12-25: 1.5 mg via TRANSDERMAL

## 2021-12-25 MED ORDER — PROPOFOL 10 MG/ML IV BOLUS
INTRAVENOUS | Status: AC
  Filled 2021-12-25: qty 20

## 2021-12-25 MED ORDER — GLYCOPYRROLATE PF 0.2 MG/ML IJ SOSY
PREFILLED_SYRINGE | INTRAMUSCULAR | Status: AC
  Filled 2021-12-25: qty 1

## 2021-12-25 MED ORDER — ONDANSETRON HCL 4 MG/2ML IJ SOLN: 4.0000 mg | Freq: Once | INTRAMUSCULAR | Status: DC | PRN

## 2021-12-25 MED ORDER — PHENYLEPHRINE 80 MCG/ML (10ML) SYRINGE FOR IV PUSH (FOR BLOOD PRESSURE SUPPORT)
PREFILLED_SYRINGE | INTRAVENOUS | Status: AC
  Filled 2021-12-25: qty 10

## 2021-12-25 MED ORDER — LIDOCAINE-EPINEPHRINE 1 %-1:100000 IJ SOLN
INTRAMUSCULAR | Status: DC | PRN
  Administered 2021-12-25: 10 mL

## 2021-12-25 MED ORDER — LIDOCAINE HCL (PF) 2 % IJ SOLN
INTRAMUSCULAR | Status: AC
  Filled 2021-12-25: qty 5

## 2021-12-25 MED ORDER — FENTANYL CITRATE (PF) 100 MCG/2ML IJ SOLN
INTRAMUSCULAR | Status: DC | PRN
  Administered 2021-12-25: 100 ug via INTRAVENOUS

## 2021-12-25 MED ORDER — ONDANSETRON HCL 4 MG/2ML IJ SOLN
INTRAMUSCULAR | Status: DC | PRN
  Administered 2021-12-25: 4 mg via INTRAVENOUS

## 2021-12-25 MED ORDER — HYDROMORPHONE HCL 1 MG/ML IJ SOLN: 0.2500 mg | INTRAMUSCULAR | Status: DC | PRN

## 2021-12-25 MED ORDER — PROPOFOL 10 MG/ML IV BOLUS
INTRAVENOUS | Status: DC | PRN
  Administered 2021-12-25 (×2): 50 mg via INTRAVENOUS
  Administered 2021-12-25: 150 mg via INTRAVENOUS
  Administered 2021-12-25: 50 mg via INTRAVENOUS

## 2021-12-25 MED ORDER — CEFAZOLIN SODIUM-DEXTROSE 2-4 GM/100ML-% IV SOLN
2.0000 g | INTRAVENOUS | Status: AC
  Administered 2021-12-25: 2 g via INTRAVENOUS

## 2021-12-25 MED ORDER — MIDAZOLAM HCL 5 MG/5ML IJ SOLN
INTRAMUSCULAR | Status: DC | PRN
  Administered 2021-12-25: 2 mg via INTRAVENOUS

## 2021-12-25 MED ORDER — PHENYLEPHRINE HCL (PRESSORS) 10 MG/ML IV SOLN
INTRAVENOUS | Status: DC | PRN
  Administered 2021-12-25: 160 ug via INTRAVENOUS
  Administered 2021-12-25 (×3): 80 ug via INTRAVENOUS

## 2021-12-25 MED ORDER — ACETAMINOPHEN 500 MG PO TABS
ORAL_TABLET | ORAL | Status: AC
  Filled 2021-12-25: qty 2

## 2021-12-25 MED ORDER — AMISULPRIDE (ANTIEMETIC) 5 MG/2ML IV SOLN: 10.0000 mg | Freq: Once | INTRAVENOUS | Status: DC | PRN

## 2021-12-25 MED ORDER — KETOROLAC TROMETHAMINE 30 MG/ML IJ SOLN
INTRAMUSCULAR | Status: AC
  Filled 2021-12-25: qty 1

## 2021-12-25 MED ORDER — FENTANYL CITRATE (PF) 100 MCG/2ML IJ SOLN
INTRAMUSCULAR | Status: AC
  Filled 2021-12-25: qty 2

## 2021-12-25 MED ORDER — MIDAZOLAM HCL 2 MG/2ML IJ SOLN
INTRAMUSCULAR | Status: AC
  Filled 2021-12-25: qty 2

## 2021-12-25 MED ORDER — GLYCOPYRROLATE 0.2 MG/ML IJ SOLN
INTRAMUSCULAR | Status: DC | PRN
  Administered 2021-12-25: .2 mg via INTRAVENOUS

## 2021-12-25 MED ORDER — CEFAZOLIN SODIUM-DEXTROSE 2-4 GM/100ML-% IV SOLN
INTRAVENOUS | Status: AC
  Filled 2021-12-25: qty 100

## 2021-12-25 MED ORDER — OXYCODONE HCL 5 MG/5ML PO SOLN: 5.0000 mg | Freq: Once | ORAL | Status: DC | PRN

## 2021-12-25 MED ORDER — 0.9 % SODIUM CHLORIDE (POUR BTL) OPTIME
TOPICAL | Status: DC | PRN
  Administered 2021-12-25: 500 mL

## 2021-12-25 MED ORDER — OXYCODONE HCL 5 MG PO TABS: 5.0000 mg | ORAL_TABLET | Freq: Once | ORAL | Status: DC | PRN

## 2021-12-25 MED ORDER — EPHEDRINE 5 MG/ML INJ
INTRAVENOUS | Status: AC
  Filled 2021-12-25: qty 5

## 2021-12-25 MED ORDER — ACETAMINOPHEN 500 MG PO TABS
1000.0000 mg | ORAL_TABLET | Freq: Once | ORAL | Status: AC
Start: 2021-12-25 — End: 2021-12-25
  Administered 2021-12-25: 1000 mg via ORAL

## 2021-12-25 MED ORDER — DEXAMETHASONE SODIUM PHOSPHATE 4 MG/ML IJ SOLN
INTRAMUSCULAR | Status: DC | PRN
  Administered 2021-12-25: 10 mg via INTRAVENOUS

## 2021-12-25 MED ORDER — KETOROLAC TROMETHAMINE 30 MG/ML IJ SOLN
INTRAMUSCULAR | Status: DC | PRN
  Administered 2021-12-25: 30 mg via INTRAVENOUS

## 2021-12-25 MED ORDER — SCOPOLAMINE 1 MG/3DAYS TD PT72
MEDICATED_PATCH | TRANSDERMAL | Status: AC
  Filled 2021-12-25: qty 1

## 2021-12-25 SURGICAL SUPPLY — 34 items
AGENT HMST KT MTR STRL THRMB (HEMOSTASIS)
BLADE CLIPPER SENSICLIP SURGIC (BLADE) ×1 IMPLANT
BLADE SURG 15 STRL LF DISP TIS (BLADE) ×1 IMPLANT
BLADE SURG 15 STRL SS (BLADE)
DECANTER SPIKE VIAL GLASS SM (MISCELLANEOUS) ×1 IMPLANT
DEVICE CAPIO SLIM SINGLE (INSTRUMENTS) IMPLANT
GAUZE 4X4 16PLY ~~LOC~~+RFID DBL (SPONGE) ×2 IMPLANT
GLOVE BIO SURGEON STRL SZ 6 (GLOVE) ×3 IMPLANT
GLOVE BIOGEL PI IND STRL 6.5 (GLOVE) ×1 IMPLANT
GLOVE BIOGEL PI IND STRL 7.0 (GLOVE) ×1 IMPLANT
GLOVE BIOGEL PI INDICATOR 6.5 (GLOVE) ×1
GLOVE BIOGEL PI INDICATOR 7.0 (GLOVE) ×1
GOWN STRL REUS W/TWL LRG LVL3 (GOWN DISPOSABLE) ×3 IMPLANT
HIBICLENS CHG 4% 4OZ BTL (MISCELLANEOUS) ×2 IMPLANT
HOLDER FOLEY CATH W/STRAP (MISCELLANEOUS) ×2 IMPLANT
KIT TURNOVER CYSTO (KITS) ×2 IMPLANT
NDL MAYO 6 CRC TAPER PT (NEEDLE) IMPLANT
NEEDLE HYPO 22GX1.5 SAFETY (NEEDLE) ×2 IMPLANT
NEEDLE MAYO 6 CRC TAPER PT (NEEDLE) IMPLANT
NS IRRIG 1000ML POUR BTL (IV SOLUTION) ×2 IMPLANT
PACK VAGINAL WOMENS (CUSTOM PROCEDURE TRAY) ×2 IMPLANT
RETRACTOR LONE STAR DISPOSABLE (INSTRUMENTS) ×2 IMPLANT
RETRACTOR STAY HOOK 5MM (MISCELLANEOUS) ×2 IMPLANT
SET IRRIG Y TYPE TUR BLADDER L (SET/KITS/TRAYS/PACK) IMPLANT
SURGIFLO W/THROMBIN 8M KIT (HEMOSTASIS) IMPLANT
SUT ABS MONO DBL WITH NDL 48IN (SUTURE) IMPLANT
SUT VIC AB 0 CT1 27 (SUTURE)
SUT VIC AB 0 CT1 27XBRD ANTBC (SUTURE) IMPLANT
SUT VIC AB 2-0 SH 27 (SUTURE) ×4
SUT VIC AB 2-0 SH 27XBRD (SUTURE) IMPLANT
SUT VICRYL 2-0 SH 8X27 (SUTURE) ×2 IMPLANT
SYR BULB EAR ULCER 3OZ GRN STR (SYRINGE) ×2 IMPLANT
TOWEL OR 17X26 10 PK STRL BLUE (TOWEL DISPOSABLE) ×2 IMPLANT
TRAY FOLEY W/BAG SLVR 14FR LF (SET/KITS/TRAYS/PACK) ×2 IMPLANT

## 2021-12-25 NOTE — Anesthesia Postprocedure Evaluation (Signed)
Anesthesia Post Note  Patient: Robin Benton  Procedure(s) Performed: POSTERIOR REPAIR (RECTOCELE) (Vagina )     Patient location during evaluation: PACU Anesthesia Type: General Level of consciousness: awake and alert, oriented and patient cooperative Pain management: pain level controlled Vital Signs Assessment: post-procedure vital signs reviewed and stable Respiratory status: spontaneous breathing, nonlabored ventilation and respiratory function stable Cardiovascular status: blood pressure returned to baseline and stable Postop Assessment: no apparent nausea or vomiting Anesthetic complications: no   No notable events documented.  Last Vitals:  Vitals:   12/25/21 0827 12/25/21 0830  BP: 121/74   Pulse: 92   Resp: 15   Temp: 36.6 C   SpO2: 99% 100%    Last Pain:  Vitals:   12/25/21 0830  TempSrc:   PainSc: 0-No pain                 Pervis Hocking

## 2021-12-25 NOTE — Interval H&P Note (Signed)
History and Physical Interval Note:  12/25/2021 7:15 AM  Robin Benton  has presented today for surgery, with the diagnosis of posterior vaginal prolpase.  The various methods of treatment have been discussed with the patient and family. After consideration of risks, benefits and other options for treatment, the patient has consented to  Procedure(s) with comments: POSTERIOR REPAIR (RECTOCELE) (N/A)   as a surgical intervention.    Vitals:   12/25/21 0555  BP: 112/66  Pulse: 77  Resp: 16  Temp: 98.2 F (36.8 C)    Gen: NAD CV: S1 S2 RRR Lungs: Clear to auscultation bilaterally Abd: soft, nontender  The patient's history has been reviewed, patient examined, no change in status, stable for surgery.  I have reviewed the patient's chart and labs.  Questions were answered to the patient's satisfaction.     [name]

## 2021-12-25 NOTE — Discharge Instructions (Addendum)

## 2021-12-25 NOTE — Telephone Encounter (Signed)
Robin Benton underwent posterior repair on 12/25/21.   She did not have a formal voiding trial but voided without difficulty prior to discharge.   She was discharged without a catheter. Please call her for a routine post op check. Thanks!  [name], MD

## 2021-12-25 NOTE — Anesthesia Procedure Notes (Signed)
Procedure Name: LMA Insertion Date/Time: 12/25/2021 7:35 AM  Performed by: Justice Rocher, CRNAPre-anesthesia Checklist: Patient identified, Emergency Drugs available, Suction available, Patient being monitored and Timeout performed Patient Re-evaluated:Patient Re-evaluated prior to induction Oxygen Delivery Method: Circle system utilized Preoxygenation: Pre-oxygenation with 100% oxygen Induction Type: IV induction Ventilation: Mask ventilation without difficulty LMA: LMA inserted LMA Size: 4.0 Number of attempts: 1 Airway Equipment and Method: Bite block Placement Confirmation: positive ETCO2, breath sounds checked- equal and bilateral and CO2 detector Tube secured with: Tape Dental Injury: Teeth and Oropharynx as per pre-operative assessment

## 2021-12-25 NOTE — Op Note (Signed)
Operative Note  Preoperative Diagnosis: posterior vaginal prolapse  Postoperative Diagnosis: same  Procedures performed:  Exam under anesthesia, posterior repair  Implants: none  Attending Surgeon: Sherlene Shams, MD  Anesthesia: General LMA  Findings: Stage II posterior vaginal wall prolapse   Specimens: * No specimens in log *  Estimated blood loss: 25 mL  IV fluids: see flowsheet   Urine output: 893 mL  Complications: none  Procedure in Detail:  After informed consent was obtained, the patient was taken to the operating room where general anesthesia was induced. She was placed in dorsal lithotomy position, taking care to avoid any traction of the extremities and prepped and draped in the usual sterile fashion. A self-retaining lonestar retractor was placed using four elastic blue stays.  After a foley catheter was inserted into the urethra. A rectal exam was performed to identify the posterior wall defect. Two Allis clamps were placed in the midline of the posterior vaginal wall defect.  1% lidocaine with epinephrine was injected into the vaginal mucosa. A vertical incision was made between these clamps with a 15 blade scalpel.  The rectovaginal septum was then dissected off the vaginal mucosa bilaterally.  No enterocele was noted.  The rectovaginal septum was then reapproximated with pursestring and plicating sutures of 2-0 Vicryl.  After placement of the first plication stitch two fingers were inserted into the vaginal to confirm adequate caliber.  The last distal stitch incorporated the perineal body in a U stitch fashion.  After plication, the excess vaginal mucosa was trimmed and the vaginal mucosa was reapproximated using 2-0 Vicryl sutures in a running fashion.  The vagina was copiously irrigated and hemostasis was noted.  Vaginal packing was not placed.  A rectal examination was normal and confirmed no sutures within the rectum. The foley catheter was removed. The patient  tolerated the procedure well.  She was awakened from anesthesia and transferred to the recovery room in stable condition. All counts were correct x 2.    Robin Folds, MD

## 2021-12-25 NOTE — Transfer of Care (Addendum)
Immediate Anesthesia Transfer of Care Note  Patient: Robin Benton  Procedure(s) Performed: Procedure(s) (LRB): POSTERIOR REPAIR (RECTOCELE) (N/A)  Patient Location: PACU  Anesthesia Type: General  Level of Consciousness: awake, sedated, patient cooperative and responds to stimulation  Airway & Oxygen Therapy: Patient Spontanous Breathing and Patient connected to [place] oxygen  Post-op Assessment: Report given to PACU RN, Post -op Vital signs reviewed and stable and Patient moving all extremities  Post vital signs: Reviewed and stable  Complications: No apparent anesthesia complications

## 2021-12-26 ENCOUNTER — Encounter (HOSPITAL_BASED_OUTPATIENT_CLINIC_OR_DEPARTMENT_OTHER): Payer: Self-pay | Admitting: Obstetrics and Gynecology

## 2021-12-27 NOTE — Telephone Encounter (Signed)
Post- Op Call  Robin Benton underwent posterior repair on 12/25/21 with Dr Wannetta Sender. The patient reports that her pain is controlled. She is taking ibuprofen and tylenol. She reports vaginal bleeding that is small. She has not had a bowel movement and is taking miralax and stool softener for a bowel regimen. She was discharged without a catheter and is voiding adequately. Advised to call with any problems or concerns.   Blenda Nicely, RN

## 2021-12-28 ENCOUNTER — Encounter: Payer: Self-pay | Admitting: *Deleted

## 2022-01-27 ENCOUNTER — Telehealth: Payer: Commercial Managed Care - PPO | Admitting: Nurse Practitioner

## 2022-01-27 DIAGNOSIS — N3090 Cystitis, unspecified without hematuria: Secondary | ICD-10-CM | POA: Diagnosis not present

## 2022-01-27 MED ORDER — NITROFURANTOIN MONOHYD MACRO 100 MG PO CAPS: 100.0000 mg | ORAL_CAPSULE | Freq: Two times a day (BID) | ORAL | 0 refills | Status: AC

## 2022-01-27 NOTE — Patient Instructions (Signed)
Pearla [facility], thank you for joining Gildardo Pounds, NP for today's virtual visit.  While this provider is not your primary care provider (PCP), if your PCP is located in our provider database this encounter information will be shared with them immediately following your visit.  Consent: (Patient) Robin Benton provided verbal consent for this virtual visit at the beginning of the encounter.  Current Medications:  Current Outpatient Medications:    acetaminophen (TYLENOL) 500 MG tablet, Take 1 tablet (500 mg total) by mouth every 6 (six) hours as needed (pain)., Disp: 30 tablet, Rfl: 0   Cholecalciferol (VITAMIN D3 GUMMIES) 25 MCG (1000 UT) CHEW, Chew 2,000 Units by mouth., Disp: , Rfl:    Collagen-Vitamin C-Biotin (COLLAGEN 1500/C PO), Take by mouth., Disp: , Rfl:    esomeprazole (NEXIUM) 20 MG capsule, Take 20 mg by mouth daily at 12 noon., Disp: , Rfl:    ibuprofen (ADVIL) 600 MG tablet, Take 1 tablet (600 mg total) by mouth every 6 (six) hours as needed., Disp: 30 tablet, Rfl: 0   Iron, Ferrous Sulfate, 325 (65 Fe) MG TABS, Take 325 mg by mouth daily., Disp: 30 tablet, Rfl: 2   Multiple Vitamin (MULTIVITAMIN) tablet, Take 1 tablet by mouth daily., Disp: , Rfl:    nitrofurantoin, macrocrystal-monohydrate, (MACROBID) 100 MG capsule, Take 1 capsule (100 mg total) by mouth 2 (two) times daily for 5 days., Disp: 10 capsule, Rfl: 0   NORITATE 1 % cream, Apply topically daily., Disp: , Rfl:    oxyCODONE (OXY IR/ROXICODONE) 5 MG immediate release tablet, Take 1 tablet (5 mg total) by mouth every 4 (four) hours as needed for severe pain., Disp: 5 tablet, Rfl: 0   polyethylene glycol powder (GLYCOLAX/MIRALAX) 17 GM/SCOOP powder, Take 17 g by mouth daily. Drink 17g (1 scoop) dissolved in water per day., Disp: 255 g, Rfl: 0   Probiotic Product (PROBIOTIC-10 PO), Take by mouth., Disp: , Rfl:    rizatriptan (MAXALT) 10 MG tablet, Take 1 tablet (10 mg total) by mouth as needed for migraine. May repeat in 2 hours  if needed, Disp: 10 tablet, Rfl: 0   solifenacin (VESICARE) 5 MG tablet, Take 1 tablet (5 mg total) by mouth daily., Disp: 30 tablet, Rfl: 5   Medications ordered in this encounter:  Meds ordered this encounter  Medications   nitrofurantoin, macrocrystal-monohydrate, (MACROBID) 100 MG capsule    Sig: Take 1 capsule (100 mg total) by mouth 2 (two) times daily for 5 days.    Dispense:  10 capsule    Refill:  0    Order Specific Question:   Supervising Provider    Answer:   Sabra Heck, [place]     *If you need refills on other medications prior to your next appointment, please contact your pharmacy*  Follow-Up: Call back or seek an in-person evaluation if the symptoms worsen or if the condition fails to improve as anticipated.     If you have been instructed to have an in-person evaluation today at a local Urgent Care facility, please use the link below. It will take you to a list of all of our available [facility], including address, phone number and hours of operation. Please do not delay care.  [facility]  If you or a family member do not have a primary care provider, use the link below to schedule a visit and establish care. When you choose a [facility] primary care physician or advanced practice provider, you gain a  long-term partner in health. Find a Primary Care Provider  Learn more about [facility] in-office and virtual care options: [facility]

## 2022-01-27 NOTE — Progress Notes (Signed)
Virtual Visit Consent   Robin Benton, you are scheduled for a virtual visit with a [facility] provider today. Just as with appointments in the office, your consent must be obtained to participate. Your consent will be active for this visit and any virtual visit you may have with one of our providers in the next 365 days. If you have a MyChart account, a copy of this consent can be sent to you electronically.  As this is a virtual visit, video technology does not allow for your provider to perform a traditional examination. This may limit your provider's ability to fully assess your condition. If your provider identifies any concerns that need to be evaluated in person or the need to arrange testing (such as labs, EKG, etc.), we will make arrangements to do so. Although advances in technology are sophisticated, we cannot ensure that it will always work on either your end or our end. If the connection with a video visit is poor, the visit may have to be switched to a telephone visit. With either a video or telephone visit, we are not always able to ensure that we have a secure connection.  By engaging in this virtual visit, you consent to the provision of healthcare and authorize for your insurance to be billed (if applicable) for the services provided during this visit. Depending on your insurance coverage, you may receive a charge related to this service.  I need to obtain your verbal consent now. Are you willing to proceed with your visit today? Robin Benton has provided verbal consent on 01/27/2022 for a virtual visit (video or telephone). Gildardo Pounds, NP  Date: 01/27/2022 1:26 PM  Virtual Visit via Video Note   I, Gildardo Pounds, connected with  Robin Benton  ([id number], 09/03/1970) on 01/27/22 at  1:00 PM EDT by a video-enabled telemedicine application and verified that I am speaking with the correct person using two identifiers.  Location: Patient: Virtual Visit Location Patient: Home Provider:  Virtual Visit Location Provider: Home Office   I discussed the limitations of evaluation and management by telemedicine and the availability of in person appointments. The patient expressed understanding and agreed to proceed.    History of Present Illness: Robin Benton is a 51 y.o. who identifies as a female who was assigned female at birth, and is being seen today for UTI symptoms.  Several days ago began experiencing painful urination, pelvic discomfort, frequent urination, urgency, and sensation of incomplete bladder emptying. Denies flank pain or hematuria   Problems:  Patient Active Problem List   Diagnosis Date Noted   Rosacea 08/03/2021   Rosacea conjunctivitis of both eyes 08/03/2021   Vaginal discharge 08/03/2021   Encounter for screening mammogram for malignant neoplasm of breast 08/03/2021   Screening for colon cancer 08/03/2021   Functional urinary incontinence 08/03/2021   Rectal pressure 08/03/2021   Iron deficiency anemia secondary to inadequate dietary iron intake 08/03/2021   Migraine with aura and without status migrainosus, not intractable 05/24/2020   Rectocele 05/24/2020   Class 1 obesity due to excess calories without serious comorbidity with body mass index (BMI) of 32.0 to 32.9 in adult 01/09/2020   Vitamin D deficiency 01/09/2020   Abnormal menstrual periods 01/09/2020   Gastroesophageal reflux disease 01/09/2020    Allergies:  Allergies  Allergen Reactions   Sumatriptan Other (See Comments)    Made her feel weird   Medications:  Current Outpatient Medications:    acetaminophen (TYLENOL) 500 MG tablet, Take 1 tablet (  500 mg total) by mouth every 6 (six) hours as needed (pain)., Disp: 30 tablet, Rfl: 0   Cholecalciferol (VITAMIN D3 GUMMIES) 25 MCG (1000 UT) CHEW, Chew 2,000 Units by mouth., Disp: , Rfl:    Collagen-Vitamin C-Biotin (COLLAGEN 1500/C PO), Take by mouth., Disp: , Rfl:    esomeprazole (NEXIUM) 20 MG capsule, Take 20 mg by mouth daily at 12  noon., Disp: , Rfl:    ibuprofen (ADVIL) 600 MG tablet, Take 1 tablet (600 mg total) by mouth every 6 (six) hours as needed., Disp: 30 tablet, Rfl: 0   Iron, Ferrous Sulfate, 325 (65 Fe) MG TABS, Take 325 mg by mouth daily., Disp: 30 tablet, Rfl: 2   Multiple Vitamin (MULTIVITAMIN) tablet, Take 1 tablet by mouth daily., Disp: , Rfl:    nitrofurantoin, macrocrystal-monohydrate, (MACROBID) 100 MG capsule, Take 1 capsule (100 mg total) by mouth 2 (two) times daily for 5 days., Disp: 10 capsule, Rfl: 0   NORITATE 1 % cream, Apply topically daily., Disp: , Rfl:    oxyCODONE (OXY IR/ROXICODONE) 5 MG immediate release tablet, Take 1 tablet (5 mg total) by mouth every 4 (four) hours as needed for severe pain., Disp: 5 tablet, Rfl: 0   polyethylene glycol powder (GLYCOLAX/MIRALAX) 17 GM/SCOOP powder, Take 17 g by mouth daily. Drink 17g (1 scoop) dissolved in water per day., Disp: 255 g, Rfl: 0   Probiotic Product (PROBIOTIC-10 PO), Take by mouth., Disp: , Rfl:    rizatriptan (MAXALT) 10 MG tablet, Take 1 tablet (10 mg total) by mouth as needed for migraine. May repeat in 2 hours if needed, Disp: 10 tablet, Rfl: 0   solifenacin (VESICARE) 5 MG tablet, Take 1 tablet (5 mg total) by mouth daily., Disp: 30 tablet, Rfl: 5  Observations/Objective: Patient is well-developed, well-nourished in no acute distress.  Resting comfortably at home.  Head is normocephalic, atraumatic.  No labored breathing.  Speech is clear and coherent with logical content.  Patient is alert and oriented at baseline.    Assessment and Plan: 1. Cystitis - nitrofurantoin, macrocrystal-monohydrate, (MACROBID) 100 MG capsule; Take 1 capsule (100 mg total) by mouth 2 (two) times daily for 5 days.  Dispense: 10 capsule; Refill: 0   Follow Up Instructions: I discussed the assessment and treatment plan with the patient. The patient was provided an opportunity to ask questions and all were answered. The patient agreed with the plan and  demonstrated an understanding of the instructions.  A copy of instructions were sent to the patient via MyChart unless otherwise noted below.    The patient was advised to call back or seek an in-person evaluation if the symptoms worsen or if the condition fails to improve as anticipated.  Time:  I spent 11 minutes with the patient via telehealth technology discussing the above problems/concerns.    Gildardo Pounds, NP

## 2022-02-02 ENCOUNTER — Encounter: Payer: Self-pay | Admitting: Obstetrics and Gynecology

## 2022-02-02 ENCOUNTER — Ambulatory Visit (INDEPENDENT_AMBULATORY_CARE_PROVIDER_SITE_OTHER): Payer: Commercial Managed Care - PPO | Admitting: Obstetrics and Gynecology

## 2022-02-02 VITALS — BP 98/65 | HR 97

## 2022-02-02 DIAGNOSIS — M791 Myalgia, unspecified site: Secondary | ICD-10-CM

## 2022-02-02 DIAGNOSIS — Z9889 Other specified postprocedural states: Secondary | ICD-10-CM

## 2022-02-02 MED ORDER — CYCLOBENZAPRINE HCL 5 MG PO TABS: 5.0000 mg | ORAL_TABLET | Freq: Three times a day (TID) | ORAL | 1 refills | Status: DC | PRN

## 2022-02-02 NOTE — Progress Notes (Signed)
[facility]  Date of Visit: 02/02/2022  History of Present Illness: Ms. Drollinger is a 51 y.o. female scheduled today for a post-operative visit.   Surgery: s/p posterior repair on 12/25/21  She passed her postoperative void trial.   Postoperative course has been uncomplicated.   Today she reports she was feeling better after about 3 weeks. Has been following restrictions but feels some pressure.   UTI in the last 6 weeks? No  Pain? No  She has returned to her normal activity (except for postop restrictions) Vaginal bulge?  Unsure, feels pressure Stress incontinence: No  Urgency/frequency: No  Urge incontinence: No  Voiding dysfunction: Yes  Bowel issues: No - has been taking the miralax  Subjective Success: Do you usually have a bulge or something falling out that you can see or feel in the vaginal area? No  Retreatment Success: Any retreatment with surgery or pessary for any compartment? No    Medications: She has a current medication list which includes the following prescription(s): vitamin d3 gummies, collagen-vitamin c-biotin, cyclobenzaprine, esomeprazole, iron (ferrous sulfate), multivitamin, noritate, polyethylene glycol powder, probiotic product, and solifenacin.   Allergies: Patient is allergic to sumatriptan.   Physical Exam: BP 98/65   Pulse 97   LMP 01/02/2022    Pelvic Examination: Vagina: Incisions healing well. Sutures are present at incision line and there is not granulation tissue. No tenderness along the anterior or posterior vagina. Mild increased pelvic floor muscle tension without any specific areas of tenderness.    POP-Q: POP-Q  -2.5                                            Aa   -2.5                                           Ba  -8                                              C   3.5                                            Gh  3                                            Pb  8                                            tvl    -3                                            Ap  -3  Bp  -8                                              D     ---------------------------------------------------------  Assessment and Plan:  1. Post-operative state   2. Muscle pain     - Healing well. Increased pelvic floor muscle tension may be causing some pelvic pressure sensation. Prescribed flexeril '5mg'$  daily. Advised patient to reach out if she has not had improvement in her symptoms in the next month.  - Can resume regular activity including exercise, wait an additional two weeks for intercourse.  - Discussed avoidance of heavy lifting and straining long term to reduce the risk of recurrence. Should continue with the miralax.  - Did not address if patient is taking vesicare still, will send message to confirm.   Follow up as needed  [name], MD

## 2022-03-26 ENCOUNTER — Encounter: Payer: Self-pay | Admitting: *Deleted

## 2022-05-01 DIAGNOSIS — M659 Synovitis and tenosynovitis, unspecified: Secondary | ICD-10-CM | POA: Insufficient documentation

## 2022-05-25 ENCOUNTER — Other Ambulatory Visit: Payer: Commercial Managed Care - PPO

## 2022-06-01 ENCOUNTER — Encounter: Payer: Commercial Managed Care - PPO | Admitting: Family

## 2022-06-07 ENCOUNTER — Encounter: Payer: Commercial Managed Care - PPO | Admitting: Family

## 2022-06-08 ENCOUNTER — Ambulatory Visit (INDEPENDENT_AMBULATORY_CARE_PROVIDER_SITE_OTHER): Payer: Commercial Managed Care - PPO | Admitting: Family

## 2022-06-08 ENCOUNTER — Other Ambulatory Visit (HOSPITAL_COMMUNITY)
Admission: RE | Admit: 2022-06-08 | Discharge: 2022-06-08 | Disposition: A | Discharge status: Home / Self Care | Payer: Commercial Managed Care - PPO | Source: Ambulatory Visit | Attending: Family | Admitting: Family

## 2022-06-08 ENCOUNTER — Encounter: Payer: Self-pay | Admitting: Family

## 2022-06-08 VITALS — BP 118/72 | HR 86 | Ht 65.0 in | Wt 203.0 lb

## 2022-06-08 DIAGNOSIS — Z01411 Encounter for gynecological examination (general) (routine) with abnormal findings: Secondary | ICD-10-CM | POA: Insufficient documentation

## 2022-06-08 DIAGNOSIS — R3915 Urgency of urination: Secondary | ICD-10-CM

## 2022-06-08 DIAGNOSIS — M791 Myalgia, unspecified site: Secondary | ICD-10-CM | POA: Diagnosis not present

## 2022-06-08 DIAGNOSIS — R3981 Functional urinary incontinence: Secondary | ICD-10-CM

## 2022-06-08 DIAGNOSIS — E7800 Pure hypercholesterolemia, unspecified: Secondary | ICD-10-CM

## 2022-06-08 DIAGNOSIS — Z23 Encounter for immunization: Secondary | ICD-10-CM

## 2022-06-08 DIAGNOSIS — Z1211 Encounter for screening for malignant neoplasm of colon: Secondary | ICD-10-CM

## 2022-06-08 DIAGNOSIS — R102 Pelvic and perineal pain: Secondary | ICD-10-CM

## 2022-06-08 DIAGNOSIS — N898 Other specified noninflammatory disorders of vagina: Secondary | ICD-10-CM

## 2022-06-08 DIAGNOSIS — R198 Other specified symptoms and signs involving the digestive system and abdomen: Secondary | ICD-10-CM

## 2022-06-08 DIAGNOSIS — Z1231 Encounter for screening mammogram for malignant neoplasm of breast: Secondary | ICD-10-CM

## 2022-06-08 DIAGNOSIS — N951 Menopausal and female climacteric states: Secondary | ICD-10-CM

## 2022-06-08 DIAGNOSIS — D508 Other iron deficiency anemias: Secondary | ICD-10-CM

## 2022-06-08 LAB — CBC WITH DIFFERENTIAL/PLATELET
Basophils Absolute: 0 10*3/uL (ref 0.0–0.1)
Basophils Relative: 0.4 % (ref 0.0–3.0)
Eosinophils Absolute: 0.3 10*3/uL (ref 0.0–0.7)
Eosinophils Relative: 3.5 % (ref 0.0–5.0)
HCT: 43.1 % (ref 36.0–46.0)
Hemoglobin: 14.3 g/dL (ref 12.0–15.0)
Lymphocytes Relative: 25.3 % (ref 12.0–46.0)
Lymphs Abs: 2.1 10*3/uL (ref 0.7–4.0)
MCHC: 33.1 g/dL (ref 30.0–36.0)
MCV: 91.7 fl (ref 78.0–100.0)
Monocytes Absolute: 0.7 10*3/uL (ref 0.1–1.0)
Monocytes Relative: 8.1 % (ref 3.0–12.0)
Neutro Abs: 5.3 10*3/uL (ref 1.4–7.7)
Neutrophils Relative %: 62.7 % (ref 43.0–77.0)
Platelets: 460 10*3/uL — ABNORMAL HIGH (ref 150.0–400.0)
RBC: 4.7 Mil/uL (ref 3.87–5.11)
RDW: 13.4 % (ref 11.5–15.5)
WBC: 8.4 10*3/uL (ref 4.0–10.5)

## 2022-06-08 LAB — IBC + FERRITIN
Ferritin: 59.7 ng/mL (ref 10.0–291.0)
Iron: 66 ug/dL (ref 42–145)
Saturation Ratios: 20.4 % (ref 20.0–50.0)
TIBC: 323.4 ug/dL (ref 250.0–450.0)
Transferrin: 231 mg/dL (ref 212.0–360.0)

## 2022-06-08 LAB — BASIC METABOLIC PANEL
BUN: 15 mg/dL (ref 6–23)
CO2: 28 mEq/L (ref 19–32)
Calcium: 9.6 mg/dL (ref 8.4–10.5)
Chloride: 102 mEq/L (ref 96–112)
Creatinine, Ser: 0.87 mg/dL (ref 0.40–1.20)
GFR: 78.27 mL/min (ref >60.00)
Glucose, Bld: 83 mg/dL (ref 70–99)
Potassium: 4.5 mEq/L (ref 3.5–5.1)
Sodium: 138 mEq/L (ref 135–145)

## 2022-06-08 LAB — LIPID PANEL
Cholesterol: 233 mg/dL — ABNORMAL HIGH (ref 0–200)
HDL: 39.2 mg/dL (ref >39.00)
LDL Cholesterol: 155 mg/dL — ABNORMAL HIGH (ref 0–99)
NonHDL: 193.91
Total CHOL/HDL Ratio: 6
Triglycerides: 196 mg/dL — ABNORMAL HIGH (ref 0.0–149.0)
VLDL: 39.2 mg/dL (ref 0.0–40.0)

## 2022-06-08 MED ORDER — CYCLOBENZAPRINE HCL 5 MG PO TABS: 5.0000 mg | ORAL_TABLET | Freq: Three times a day (TID) | ORAL | 1 refills | Status: DC | PRN

## 2022-06-08 NOTE — Patient Instructions (Addendum)
  Stop by the lab prior to leaving today. I will notify you of your results once received.   ------------------------------------  I recommend you take some muscle relaxer to see if this helps your pressure and also reach back out to Dr. Wannetta Sender.   ------------------------------------ A referral was placed today for Gi to set up your colonoscopy.  Please let us know if you have not heard back within 1 week about your referral.  ------------------------------------  I have sent an electronic order over to your preferred location for the following:  Do not schedule until after September 23, 2022  '[]'$   2D Mammogram  '[x]'$   3D Mammogram  '[]'$   Bone Density   Please give this center a call to get scheduled at your convenience.  '[x]'$   [facility]  [place] [id number]  [phone]  Make sure to wear two piece  clothing  No lotions powders or deodorants the day of the appointment Make sure to bring picture ID and insurance card.  Bring list of medications you are currently taking including any supplements.   ------------------------------------

## 2022-06-08 NOTE — Assessment & Plan Note (Signed)
stable °

## 2022-06-08 NOTE — Progress Notes (Unsigned)
Established Patient Office Visit  Subjective:  Patient ID: Robin Benton, female    DOB: [birth year]  Age: 52 y.o. MRN: [id number]  CC:  Chief Complaint  Patient presents with   Annual Exam    W/PAP    HPI Robin Benton is here today for an well woman exam.   Pt is without acute concerns.   Rectocele: posterior repair 12/26/22 with Dr. Wannetta Sender, f/u with her was on 02/02/22 did note some pressure then and she states in the last few weeks feeling similar pressure to prior when she had the rectocele. She does admit to lifting some heavier type boxes with christmas.   Denies urinary urgency, frequency, and or dysuria.   She does also state she woke up out of sleep with rectal pressure and lasted for a few minutes, and right at anal opening. She states last episode was about one month ago and prior had a few weeks prior to that. Not noticing any blood in stool. Denies constipation as she is taking miralax daily. Does take a daily stool softener. She has decided to stop taking vesicare and has not noticed as much incontinence since. She did have physical therapy for pelvic floor prior to surgery.   Mammogram: 09/22/21  Last pap: 03/24/2019 negative per pt.  Colonoscopy:never had colonoscopy. Prefers cologuard. No fmh colon cancer.  Bone density scan: n/a still with menses  Flu vaccine, agreeable to getting today in office.   Fmh melanoma with dad and pgf, sees dermatologist annually.  Menses: starting to become irregular at times every two months, then three in two months. Does c/o Hot flashes   Dental exams twice a year.   Past Medical History:  Diagnosis Date   Basal cell adenocarcinoma    facial area   Basal cell carcinoma 10/25/2017   Formatting of this note might be different from the original. glabella   GERD (gastroesophageal reflux disease)    Hyperlipidemia    Other constipation 05/24/2020   PONV (postoperative nausea and vomiting)    Rosacea     Past Surgical History:   Procedure Laterality Date   MOHS SURGERY     RECTOCELE REPAIR N/A 12/25/2021   Procedure: POSTERIOR REPAIR (RECTOCELE);  Surgeon: [name], MD;  Location: [facility];  Service: Gynecology;  Laterality: N/A;    Family History  Problem Relation Age of Onset   Hyperlipidemia Mother    Depression Father    Supraventricular tachycardia Father    Hyperlipidemia Father    Melanoma Father    Diabetes Maternal Grandmother    Skin cancer Paternal Grandfather    Breast cancer Neg Hx     Social History   Socioeconomic History   Marital status: Married    Spouse name: Not on file   Number of children: 2   Years of education: Not on file   Highest education level: Not on file  Occupational History   Occupation: Copywriter, advertising    Employer: [employer]  Tobacco Use   Smoking status: Never   Smokeless tobacco: Never  Vaping Use   Vaping Use: Never used  Substance and Sexual Activity   Alcohol use: Not Currently    Alcohol/week: 2.0 standard drinks of alcohol    Types: 2 Glasses of wine per week   Drug use: Never   Sexual activity: Yes    Partners: Male    Birth control/protection: Other-see comments    Comment: husband vasectomy  Other Topics Concern  Not on file  Social History Narrative   Not on file   Social Determinants of Health   Financial Resource Strain: Not on file  Food Insecurity: Not on file  Transportation Needs: Not on file  Physical Activity: Not on file  Stress: Not on file  Social Connections: Not on file  Intimate Partner Violence: Not on file    Outpatient Medications Prior to Visit  Medication Sig Dispense Refill   Cholecalciferol (VITAMIN D3 GUMMIES) 25 MCG (1000 UT) CHEW Chew 2,000 Units by mouth.     Collagen-Vitamin C-Biotin (COLLAGEN 1500/C PO) Take by mouth.     esomeprazole (NEXIUM) 20 MG capsule Take 20 mg by mouth daily at 12 noon.     Iron, Ferrous Sulfate, 325 (65 Fe) MG TABS Take 325 mg by mouth  daily. 30 tablet 2   Multiple Vitamin (MULTIVITAMIN) tablet Take 1 tablet by mouth daily.     NORITATE 1 % cream Apply topically daily.     polyethylene glycol powder (GLYCOLAX/MIRALAX) 17 GM/SCOOP powder Take 17 g by mouth daily. Drink 17g (1 scoop) dissolved in water per day. 255 g 0   Probiotic Product (PROBIOTIC-10 PO) Take by mouth.     cyclobenzaprine (FLEXERIL) 5 MG tablet Take 1 tablet (5 mg total) by mouth 3 (three) times daily as needed for muscle spasms. 60 tablet 1   solifenacin (VESICARE) 5 MG tablet Take 1 tablet (5 mg total) by mouth daily. 30 tablet 5   No facility-administered medications prior to visit.    Allergies  Allergen Reactions   Sumatriptan Other (See Comments)    Made her feel weird    ROS Review of Systems  Review of Systems   Breasts: negative for nipple discharge, breast pain, breast mass, nipple changes, breast rash. Genitourinary:  Negative for decreased urine volume, difficulty urinating, dyspareunia, dysuria, flank pain, menstrual problem, pelvic pain,  vaginal bleeding, vaginal discharge and vaginal pain. Positive for rectal pressure. Some urinary urgency  Psychiatric/Behavioral:  Negative for depression and suicidal ideas.   All other systems reviewed and are negative.    Objective:    Physical Exam  Gen: NAD, resting comfortably Breasts: breasts appear normal, no suspicious masses, no skin or nipple changes or axillary nodes Physical Exam Genitourinary:    General: Normal vulva.     Pubic Area: No rash.      Labia:  slight erythema      Right: No rash, tenderness, lesion or injury.        Left: No rash, tenderness, lesion or injury.      Urethra: No prolapse or urethral pain.     Vagina:vaginal canal without tenderness however some skin like growths within vaginal canal with no redness, skin colored, raised and non-tender on posterior wall. No vaginal discharge, tenderness or lesions.     Cervix: slight white to clear discharge      Rectum: Normal. Nontender upon insertion. No hemorrhoids visualized.  Psych: Normal affect and thought content  BP 118/72   Pulse 86   Ht '5\' 5"'$  (1.651 m)   Wt 203 lb (92.1 kg)   LMP 05/28/2022   SpO2 96%   BMI 33.78 kg/m  Wt Readings from Last 3 Encounters:  06/08/22 203 lb (92.1 kg)  12/25/21 197 lb (89.4 kg)  10/31/21 194 lb (88 kg)     Health Maintenance Due  Topic Date Due   COLONOSCOPY (Pts 45-52yr Insurance coverage will need to be confirmed)  Never done   PAP SMEAR-Modifier  03/23/2022    There are no preventive care reminders to display for this patient.  Lab Results  Component Value Date   TSH 2.43 01/08/2020   Lab Results  Component Value Date   WBC 8.4 06/08/2022   HGB 14.3 06/08/2022   HCT 43.1 06/08/2022   MCV 91.7 06/08/2022   PLT 460.0 (H) 06/08/2022   Lab Results  Component Value Date   NA 138 06/08/2022   K 4.5 06/08/2022   CO2 28 06/08/2022   GLUCOSE 83 06/08/2022   BUN 15 06/08/2022   CREATININE 0.87 06/08/2022   BILITOT 0.5 01/08/2020   ALKPHOS 66 01/08/2020   AST 13 01/08/2020   ALT 11 01/08/2020   PROT 6.7 01/08/2020   ALBUMIN 4.3 01/08/2020   CALCIUM 9.6 06/08/2022   GFR 78.27 06/08/2022   Lab Results  Component Value Date   CHOL 233 (H) 06/08/2022   Lab Results  Component Value Date   HDL 39.20 06/08/2022   Lab Results  Component Value Date   LDLCALC 155 (H) 06/08/2022   Lab Results  Component Value Date   TRIG 196.0 (H) 06/08/2022   Lab Results  Component Value Date   CHOLHDL 6 06/08/2022   No results found for: "HGBA1C"    Assessment & Plan:   Problem List Items Addressed This Visit       Other   Functional urinary incontinence    stable      Rectal pressure - Primary    Advised pt to use muscle relaxer prn  See if this helps Avoid heavy lifting. Call and set up with urogyn as h/o rectocele repair to see if related.  Also recommended colonscopy as due, and pt is agreeable.       Iron deficiency  anemia secondary to inadequate dietary iron intake   Relevant Orders   CBC with Differential (Completed)   IBC + Ferritin (Completed)   Menopausal and female climacteric states   Elevated LDL cholesterol level   Relevant Orders   Lipid panel (Completed)   Encounter for gynecological examination (general) (routine) with abnormal findings    Pt verbalized consent for pelvic exam Declines std testing hpv thin prep ordered and pending results Pap exam in office completed, pt tolerated well.   Abn vaginal PE. Condyloma vs surgical scarring, advised pt to f/u with urogyn Dr. Wannetta Sender for further evaluation especially with ongoing rectal pressure.   Did advise pt to take muscle relaxer to see if improvement in pressure as directed by urogyn.       Relevant Orders   Lipid panel (Completed)   CBC with Differential (Completed)   IBC + Ferritin (Completed)   Basic metabolic panel (Completed)   Cytology - PAP([place])   RESOLVED: Encounter for screening mammogram for malignant neoplasm of breast   Relevant Orders   MM DIAG BREAST TOMO BILATERAL   RESOLVED: Screening for colon cancer   Relevant Orders   Ambulatory referral to Gastroenterology   Other Visit Diagnoses     Muscle pain       Relevant Medications   cyclobenzaprine (FLEXERIL) 5 MG tablet   Need for immunization against influenza       Relevant Orders   Flu Vaccine QUAD 53moIM (Fluarix, Fluzone & Alfiuria Quad PF) (Completed)   Urinary urgency       Relevant Orders   Urine Culture (Completed)   Suprapubic pressure       Relevant Orders   Urine Culture (Completed)   Vaginal lesion  Meds ordered this encounter  Medications   cyclobenzaprine (FLEXERIL) 5 MG tablet    Sig: Take 1 tablet (5 mg total) by mouth 3 (three) times daily as needed for muscle spasms.    Dispense:  60 tablet    Refill:  1    Order Specific Question:   Supervising Provider    Answer:   Diona Browner, Corliss E [[id number]]    Follow-up:  Return in about 1 year (around 06/09/2023) for f/u CPE.    Eugenia Pancoast, FNP

## 2022-06-08 NOTE — Assessment & Plan Note (Signed)
Advised pt to use muscle relaxer prn  See if this helps Avoid heavy lifting. Call and set up with urogyn as h/o rectocele repair to see if related.  Also recommended colonscopy as due, and pt is agreeable.

## 2022-06-09 LAB — URINE CULTURE
MICRO NUMBER:: 14394337
Result:: NO GROWTH
SPECIMEN QUALITY:: ADEQUATE

## 2022-06-11 ENCOUNTER — Other Ambulatory Visit: Payer: Self-pay | Admitting: *Deleted

## 2022-06-11 ENCOUNTER — Telehealth: Payer: Self-pay | Admitting: *Deleted

## 2022-06-11 DIAGNOSIS — Z01411 Encounter for gynecological examination (general) (routine) with abnormal findings: Secondary | ICD-10-CM | POA: Insufficient documentation

## 2022-06-11 DIAGNOSIS — Z1211 Encounter for screening for malignant neoplasm of colon: Secondary | ICD-10-CM

## 2022-06-11 MED ORDER — NA SULFATE-K SULFATE-MG SULF 17.5-3.13-1.6 GM/177ML PO SOLN: 1.0000 | Freq: Once | ORAL | 0 refills | Status: AC

## 2022-06-11 NOTE — Telephone Encounter (Signed)
Gastroenterology Pre-Procedure Review  Request Date: 09/14/2022 Requesting Physician: Dr. Allen Norris  PATIENT REVIEW QUESTIONS: The patient responded to the following health history questions as indicated:    1. Are you having any GI issues? no 2. Do you have a personal history of Polyps? no 3. Do you have a family history of Colon Cancer or Polyps? no 4. Diabetes Mellitus? no 5. Joint replacements in the past 12 months?no 6. Major health problems in the past 3 months?no 7. Any artificial heart valves, MVP, or defibrillator?no    MEDICATIONS & ALLERGIES:    Patient reports the following regarding taking any anticoagulation/antiplatelet therapy:   Plavix, Coumadin, Eliquis, Xarelto, Lovenox, Pradaxa, Brilinta, or Effient? no Aspirin? no  Patient confirms/reports the following medications:  Current Outpatient Medications  Medication Sig Dispense Refill   Cholecalciferol (VITAMIN D3 GUMMIES) 25 MCG (1000 UT) CHEW Chew 2,000 Units by mouth.     Collagen-Vitamin C-Biotin (COLLAGEN 1500/C PO) Take by mouth.     cyclobenzaprine (FLEXERIL) 5 MG tablet Take 1 tablet (5 mg total) by mouth 3 (three) times daily as needed for muscle spasms. 60 tablet 1   esomeprazole (NEXIUM) 20 MG capsule Take 20 mg by mouth daily at 12 noon.     Iron, Ferrous Sulfate, 325 (65 Fe) MG TABS Take 325 mg by mouth daily. 30 tablet 2   Multiple Vitamin (MULTIVITAMIN) tablet Take 1 tablet by mouth daily.     NORITATE 1 % cream Apply topically daily.     polyethylene glycol powder (GLYCOLAX/MIRALAX) 17 GM/SCOOP powder Take 17 g by mouth daily. Drink 17g (1 scoop) dissolved in water per day. 255 g 0   Probiotic Product (PROBIOTIC-10 PO) Take by mouth.     No current facility-administered medications for this visit.    Patient confirms/reports the following allergies:  Allergies  Allergen Reactions   Sumatriptan Other (See Comments)    Made her feel weird    No orders of the defined types were placed in this  encounter.   AUTHORIZATION INFORMATION Primary Insurance: 1D#: Group #:  Secondary Insurance: 1D#: Group #:  SCHEDULE INFORMATION: Date: 09/14/2022 Time: Location: [facility]

## 2022-06-11 NOTE — Assessment & Plan Note (Signed)
Pt verbalized consent for pelvic exam Declines std testing hpv thin prep ordered and pending results Pap exam in office completed, pt tolerated well.   Abn vaginal PE. Condyloma vs surgical scarring, advised pt to f/u with urogyn Dr. Wannetta Sender for further evaluation especially with ongoing rectal pressure.   Did advise pt to take muscle relaxer to see if improvement in pressure as directed by urogyn.

## 2022-06-11 NOTE — Progress Notes (Signed)
Cholesterol much too high is pt willing to start cholesterol medication. Goal <100 and is 155. Has remained stable in elevation from two years ago. Treatment Is suggested.if pt not willing can offer alternative (work on diet/exercise, low chol diet x 3 months, f/u in office 3 months and repeat fasting labs at that visit)  Plts slightly high. Could be inflammatory. Any infection? Rash?   Urine culture negative. Iron ok.

## 2022-06-12 LAB — CYTOLOGY - PAP
Adequacy: ABSENT
Comment: NEGATIVE
Diagnosis: NEGATIVE
High risk HPV: NEGATIVE

## 2022-06-19 ENCOUNTER — Encounter: Payer: Self-pay | Admitting: Obstetrics and Gynecology

## 2022-06-19 ENCOUNTER — Ambulatory Visit (INDEPENDENT_AMBULATORY_CARE_PROVIDER_SITE_OTHER): Payer: Commercial Managed Care - PPO | Admitting: Obstetrics and Gynecology

## 2022-06-19 DIAGNOSIS — M62838 Other muscle spasm: Secondary | ICD-10-CM | POA: Diagnosis not present

## 2022-06-19 MED ORDER — DIAZEPAM 5 MG PO TABS: ORAL_TABLET | ORAL | 0 refills | Status: DC

## 2022-06-19 NOTE — Progress Notes (Signed)
[facility] Return Visit  SUBJECTIVE  History of Present Illness: Robin Benton is a 52 y.o. female seen in follow-up for prolapse. She underwent posterior repair on 12/25/21.   Started feeling some pressure after lifting some boxes after christmas. Also had some coughing that made the pressure worse. Her GYN did not see any prolapse. She started taking the flexeril that was previously prescribed but has not really seen improvement. Rare issues with bladder leakage, stopped taking the leakage. Occasional leakage with cough/ sneeze. Not bothersome to her.    Past Medical History: Patient  has a past medical history of Basal cell adenocarcinoma, Basal cell carcinoma (10/25/2017), GERD (gastroesophageal reflux disease), Hyperlipidemia, Other constipation (05/24/2020), PONV (postoperative nausea and vomiting), and Rosacea.   Past Surgical History: She  has a past surgical history that includes Mohs surgery and Rectocele repair (N/A, 12/25/2021).   Medications: She has a current medication list which includes the following prescription(s): vitamin d3 gummies, collagen-vitamin c-biotin, cyclobenzaprine, diazepam, esomeprazole, iron (ferrous sulfate), multivitamin, noritate, polyethylene glycol powder, and probiotic product.   Allergies: Patient is allergic to sumatriptan.   Social History: Patient  reports that she has never smoked. She has never used smokeless tobacco. She reports that she does not currently use alcohol after a past usage of about 2.0 standard drinks of alcohol per week. She reports that she does not use drugs.      OBJECTIVE     Physical Exam: There were no vitals filed for this visit. Gen: No apparent distress, A&O x 3.  Detailed Urogynecologic Evaluation:  Normal external genitalia. Speculum exam shows normal mucosa, small scar at introitus. Blood present in vaginal vault. Normal cervix.  On palpation, increased tension in pelvic floor muscles  bilaterally  POP-Q  -2.5                                            Aa   -2.5                                           Ba  -7.5                                              C   3                                            Gh  3.5                                            Pb  9                                            tvl   -3  Ap  -3                                            Bp  -8                                              D      ASSESSMENT AND PLAN    Robin Benton is a 52 y.o. with:  1. Levator spasm    - No sign of prolapse recurrence today - Significant pelvic floor levator muscle tension bilaterally. Prescribed vaginal valium '5mg'$  nigtly prn. Advised to use every night for two weeks then as needed after.  - Also recommended pelvic PT. She had a few visits prior to surgery, but she felt that it was uncomfortable. We dicussed that some assessment can be done externally if she is not comfortable with internal evaluation. She will consider PT. If pain is not improved at follow up, will plan for referral.   Return 1 month  Robin Folds, MD

## 2022-07-20 ENCOUNTER — Ambulatory Visit: Payer: Commercial Managed Care - PPO | Admitting: Obstetrics and Gynecology

## 2022-07-20 ENCOUNTER — Encounter: Payer: Self-pay | Admitting: Obstetrics and Gynecology

## 2022-07-20 VITALS — BP 103/62 | HR 78

## 2022-07-20 DIAGNOSIS — N393 Stress incontinence (female) (male): Secondary | ICD-10-CM | POA: Diagnosis not present

## 2022-07-20 DIAGNOSIS — M62838 Other muscle spasm: Secondary | ICD-10-CM

## 2022-07-20 NOTE — Progress Notes (Signed)
[facility] Return Visit  SUBJECTIVE  History of Present Illness: Robin Benton is a 52 y.o. female seen in follow-up for prolapse. She underwent posterior repair on 12/25/21.   She was started on valium 72m for pelvic floor muscle spasm. She took this for 2 weeks and stopped doing kegel exercises and feels she had good improvement in her symptoms. Does not feel the pain and pressure at this time.   Has had some bronchitis and coughing. Still has occasional leakage with cough but it is not bothersome enough to want treatment. Not using the vesicare and not having urgency.   Past Medical History: Patient  has a past medical history of Basal cell adenocarcinoma, Basal cell carcinoma (10/25/2017), GERD (gastroesophageal reflux disease), Hyperlipidemia, Other constipation (05/24/2020), PONV (postoperative nausea and vomiting), and Rosacea.   Past Surgical History: She  has a past surgical history that includes Mohs surgery and Rectocele repair (N/A, 12/25/2021).   Medications: She has a current medication list which includes the following prescription(s): vitamin d3 gummies, collagen-vitamin c-biotin, cyclobenzaprine, diazepam, esomeprazole, iron (ferrous sulfate), multivitamin, noritate, polyethylene glycol powder, and probiotic product.   Allergies: Patient is allergic to sumatriptan.   Social History: Patient  reports that she has never smoked. She has never used smokeless tobacco. She reports that she does not currently use alcohol after a past usage of about 2.0 standard drinks of alcohol per week. She reports that she does not use drugs.      OBJECTIVE     Physical Exam: Vitals:   07/20/22 1238  BP: 103/62  Pulse: 78   Gen: No apparent distress, A&O x 3.  Detailed Urogynecologic Evaluation:  deferred  POP-Q (06/19/22)   -2.5                                            Aa   -2.5                                           Ba   -7.5                                               C    3                                            Gh   3.5                                            Pb   9                                            tvl    -3  Ap   -3                                            Bp   -8                                              D       ASSESSMENT AND PLAN    Ms. Sturgis is a 52 y.o. with:  1. Levator spasm   2. SUI (stress urinary incontinence, female)     - Pelvic floor muscle tension improved with valium. She can use this as needed. If she feels this is becoming more consistent then she should consider PT and/ or trigger point injections. Overall she is currently feeling well.  - Will monitor SUI symptoms  Return as needed  [name], MD   Time spent: I spent 15 minutes dedicated to the care of this patient on the date of this encounter to include pre-visit review of records, face-to-face time with the patient and post visit documentation.

## 2022-07-23 ENCOUNTER — Encounter: Payer: Self-pay | Admitting: *Deleted

## 2022-09-06 ENCOUNTER — Encounter: Payer: Self-pay | Admitting: Gastroenterology

## 2022-09-07 NOTE — Anesthesia Preprocedure Evaluation (Addendum)
Anesthesia Evaluation  Patient identified by MRN, date of birth, ID band Patient awake    Reviewed: Allergy & Precautions, H&P , NPO status , Patient's Chart, lab work & pertinent test results  History of Anesthesia Complications (+) PONV and history of anesthetic complications  Airway Mallampati: III  TM Distance: >3 FB Neck ROM: Full    Dental no notable dental hx.    Pulmonary neg pulmonary ROS   Pulmonary exam normal breath sounds clear to auscultation       Cardiovascular negative cardio ROS Normal cardiovascular exam Rhythm:Regular Rate:Normal     Neuro/Psych  Headaches Has headache now  negative psych ROS   GI/Hepatic Neg liver ROS,GERD  Controlled and Medicated,,  Endo/Other  negative endocrine ROS    Renal/GU negative Renal ROS Bladder dysfunction  Overactive bladder negative genitourinary   Musculoskeletal negative musculoskeletal ROS (+)    Abdominal   Peds negative pediatric ROS (+)  Hematology negative hematology ROS (+) Blood dyscrasia, anemia Iron deficiency anemia   Anesthesia Other Findings Hx BCC   Reproductive/Obstetrics negative OB ROS                             Anesthesia Physical Anesthesia Plan  ASA: 2  Anesthesia Plan: General   Post-op Pain Management:    Induction: Intravenous  PONV Risk Score and Plan:   Airway Management Planned: Natural Airway and Nasal Cannula  Additional Equipment:   Intra-op Plan:   Post-operative Plan:   Informed Consent: I have reviewed the patients History and Physical, chart, labs and discussed the procedure including the risks, benefits and alternatives for the proposed anesthesia with the patient or authorized representative who has indicated his/her understanding and acceptance.     Dental Advisory Given  Plan Discussed with: Anesthesiologist, CRNA and Surgeon  Anesthesia Plan Comments: (Patient consented  for risks of anesthesia including but not limited to:  - adverse reactions to medications - risk of airway placement if required - damage to eyes, teeth, lips or other oral mucosa - nerve damage due to positioning  - sore throat or hoarseness - Damage to heart, brain, nerves, lungs, other parts of body or loss of life  Patient voiced understanding.)       Anesthesia Quick Evaluation

## 2022-09-11 ENCOUNTER — Ambulatory Visit: Payer: Commercial Managed Care - PPO | Admitting: Family

## 2022-09-14 ENCOUNTER — Ambulatory Visit: Payer: Commercial Managed Care - PPO | Admitting: Anesthesiology

## 2022-09-14 ENCOUNTER — Other Ambulatory Visit: Payer: Self-pay

## 2022-09-14 ENCOUNTER — Encounter: Payer: Self-pay | Admitting: Gastroenterology

## 2022-09-14 ENCOUNTER — Ambulatory Visit
Admission: RE | Admit: 2022-09-14 | Discharge: 2022-09-14 | Disposition: A | Discharge status: Home / Self Care | Payer: Commercial Managed Care - PPO | Source: Ambulatory Visit | Attending: Gastroenterology | Admitting: Gastroenterology

## 2022-09-14 ENCOUNTER — Encounter
Admission: RE | Disposition: A | Discharge status: Home / Self Care | Payer: Self-pay | Source: Ambulatory Visit | Attending: Gastroenterology

## 2022-09-14 DIAGNOSIS — K219 Gastro-esophageal reflux disease without esophagitis: Secondary | ICD-10-CM | POA: Diagnosis not present

## 2022-09-14 DIAGNOSIS — K635 Polyp of colon: Secondary | ICD-10-CM | POA: Diagnosis not present

## 2022-09-14 DIAGNOSIS — Z1211 Encounter for screening for malignant neoplasm of colon: Secondary | ICD-10-CM

## 2022-09-14 DIAGNOSIS — D123 Benign neoplasm of transverse colon: Secondary | ICD-10-CM | POA: Insufficient documentation

## 2022-09-14 DIAGNOSIS — D509 Iron deficiency anemia, unspecified: Secondary | ICD-10-CM | POA: Insufficient documentation

## 2022-09-14 DIAGNOSIS — K640 First degree hemorrhoids: Secondary | ICD-10-CM | POA: Insufficient documentation

## 2022-09-14 DIAGNOSIS — K5730 Diverticulosis of large intestine without perforation or abscess without bleeding: Secondary | ICD-10-CM | POA: Insufficient documentation

## 2022-09-14 HISTORY — DX: Presence of spectacles and contact lenses: Z97.3

## 2022-09-14 HISTORY — PX: COLONOSCOPY WITH PROPOFOL: SHX5780

## 2022-09-14 HISTORY — PX: POLYPECTOMY: SHX5525

## 2022-09-14 HISTORY — DX: Motion sickness, initial encounter: T75.3XXA

## 2022-09-14 HISTORY — DX: Family history of other specified conditions: Z84.89

## 2022-09-14 HISTORY — DX: Migraine, unspecified, not intractable, without status migrainosus: G43.909

## 2022-09-14 LAB — POCT PREGNANCY, URINE: Preg Test, Ur: NEGATIVE

## 2022-09-14 SURGERY — COLONOSCOPY WITH PROPOFOL
Anesthesia: General | Site: Rectum

## 2022-09-14 MED ORDER — LIDOCAINE HCL (CARDIAC) PF 100 MG/5ML IV SOSY
PREFILLED_SYRINGE | INTRAVENOUS | Status: DC | PRN
  Administered 2022-09-14: 60 mg via INTRAVENOUS

## 2022-09-14 MED ORDER — PROPOFOL 10 MG/ML IV BOLUS
INTRAVENOUS | Status: DC | PRN
  Administered 2022-09-14 (×2): 20 mg via INTRAVENOUS
  Administered 2022-09-14: 30 mg via INTRAVENOUS
  Administered 2022-09-14: 70 mg via INTRAVENOUS
  Administered 2022-09-14: 30 mg via INTRAVENOUS
  Administered 2022-09-14: 20 mg via INTRAVENOUS
  Administered 2022-09-14: 30 mg via INTRAVENOUS

## 2022-09-14 MED ORDER — STERILE WATER FOR IRRIGATION IR SOLN
Status: DC | PRN
  Administered 2022-09-14: 150 mL

## 2022-09-14 MED ORDER — ONDANSETRON HCL 4 MG/2ML IJ SOLN
INTRAMUSCULAR | Status: DC | PRN
  Administered 2022-09-14: 4 mg via INTRAVENOUS

## 2022-09-14 MED ORDER — SODIUM CHLORIDE 0.9 % IV SOLN: INTRAVENOUS | Status: DC

## 2022-09-14 MED ORDER — LACTATED RINGERS IV SOLN: INTRAVENOUS | Status: DC

## 2022-09-14 SURGICAL SUPPLY — 21 items

## 2022-09-14 NOTE — Op Note (Signed)
[facility] Patient Name: Robin Benton Procedure Date: 09/14/2022 7:47 AM MRN: [id number] Account #: [id number] Date of Birth: 1971-05-24 Admit Type: Outpatient Age: 52 Room: [facility] OR ROOM 01 Gender: Female Note Status: Finalized Instrument Name: [id number] Procedure:             Colonoscopy Indications:           Screening for colorectal malignant neoplasm Providers:             Midge Minium MD, MD Referring MD:          Mort Sawyers (Referring MD) Medicines:             Propofol per Anesthesia Complications:         No immediate complications. Procedure:             Pre-Anesthesia Assessment:                        - Prior to the procedure, a History and Physical was                         performed, and patient medications and allergies were                         reviewed. The patient's tolerance of previous                         anesthesia was also reviewed. The risks and benefits                         of the procedure and the sedation options and risks                         were discussed with the patient. All questions were                         answered, and informed consent was obtained. Prior                         Anticoagulants: The patient has taken no anticoagulant                         or antiplatelet agents. ASA Grade Assessment: II - A                         patient with mild systemic disease. After reviewing                         the risks and benefits, the patient was deemed in                         satisfactory condition to undergo the procedure.                        After obtaining informed consent, the colonoscope was                         passed under direct vision. Throughout the procedure,  the patient's blood pressure, pulse, and oxygen                         saturations were monitored continuously. The                         Colonoscope was introduced through the anus and                          advanced to the the cecum, identified by appendiceal                         orifice and ileocecal valve. The colonoscopy was                         performed without difficulty. The patient tolerated                         the procedure well. The quality of the bowel                         preparation was excellent. Findings:      The perianal and digital rectal examinations were normal.      A 5 mm polyp was found in the transverse colon. The polyp was sessile.       The polyp was removed with a cold snare. Resection and retrieval were       complete.      Non-bleeding internal hemorrhoids were found during retroflexion. The       hemorrhoids were Grade I (internal hemorrhoids that do not prolapse).      A few small-mouthed diverticula were found in the sigmoid colon. Impression:            - One 5 mm polyp in the transverse colon, removed with                         a cold snare. Resected and retrieved.                        - Non-bleeding internal hemorrhoids.                        - Diverticulosis in the sigmoid colon. Recommendation:        - Discharge patient to home.                        - Resume previous diet.                        - Continue present medications.                        - Await pathology results.                        - If the pathology report reveals adenomatous tissue,                         then repeat the colonoscopy for surveillance in 7  years. Procedure Code(s):     --- Professional ---                        [phone], Colonoscopy, flexible; with removal of                         tumor(s), polyp(s), or other lesion(s) by snare                         technique Diagnosis Code(s):     --- Professional ---                        Z12.11, Encounter for screening for malignant neoplasm                         of colon                        D12.3, Benign neoplasm of transverse colon (hepatic                          flexure or splenic flexure) CPT copyright 2022 American Medical Association. All rights reserved. The codes documented in this report are preliminary and upon coder review may  be revised to meet current compliance requirements. Midge Minium MD, MD 09/14/2022 8:09:57 AM This report has been signed electronically. Number of Addenda: 0 Note Initiated On: 09/14/2022 7:47 AM Scope Withdrawal Time: 0 hours 7 minutes 39 seconds  Total Procedure Duration: 0 hours 12 minutes 43 seconds  Estimated Blood Loss:  Estimated blood loss: none.      [facility]

## 2022-09-14 NOTE — H&P (Signed)
Midge Minium, MD [facility] [address], [address] [place], [place] [address] Phone: [phone] Fax : [phone]  Primary Care Physician:  Mort Sawyers, FNP Primary Gastroenterologist:  Dr. Servando Snare  Pre-Procedure History & Physical: HPI:  Robin Benton is a 52 y.o. female is here for a screening colonoscopy.   Past Medical History:  Diagnosis Date   Basal cell adenocarcinoma    facial area   Basal cell carcinoma 10/25/2017   Formatting of this note might be different from the original. glabella   Family history of adverse reaction to anesthesia    Father - PONV   GERD (gastroesophageal reflux disease)    Hyperlipidemia    Migraine headache    Motion sickness    Passenger in car   Other constipation 05/24/2020   PONV (postoperative nausea and vomiting)    after wisdom tooth procedure   Rosacea    Wears contact lenses     Past Surgical History:  Procedure Laterality Date   MOHS SURGERY     RECTOCELE REPAIR N/A 12/25/2021   Procedure: POSTERIOR REPAIR (RECTOCELE);  Surgeon: Marguerita Beards, MD;  Location: [facility];  Service: Gynecology;  Laterality: N/A;   WISDOM TOOTH EXTRACTION      Prior to Admission medications   Medication Sig Start Date End Date Taking? Authorizing Provider  Cholecalciferol (VITAMIN D3 GUMMIES) 25 MCG (1000 UT) CHEW Chew 2,000 Units by mouth.   Yes [provider]  Collagen-Vitamin C-Biotin (COLLAGEN 1500/C PO) Take by mouth.   Yes [provider]  cyclobenzaprine (FLEXERIL) 5 MG tablet Take 1 tablet (5 mg total) by mouth 3 (three) times daily as needed for muscle spasms. 06/08/22  Yes Dugal, Wyatt Mage, FNP  diazepam (VALIUM) 5 MG tablet Place 1 tablet vaginally nightly as needed for muscle spasm/ pelvic pain. 06/19/22  Yes Marguerita Beards, MD  Docusate Calcium (STOOL SOFTENER PO) Take by mouth daily.   Yes [provider]  esomeprazole (NEXIUM) 20 MG capsule Take 20 mg by mouth daily at 12 noon.   Yes  [provider]  Iron, Ferrous Sulfate, 325 (65 Fe) MG TABS Take 325 mg by mouth daily. 08/03/21  Yes Mort Sawyers, FNP  Multiple Vitamin (MULTIVITAMIN) tablet Take 1 tablet by mouth daily.   Yes [provider]  Multiple Vitamins-Minerals (HAIR SKIN NAILS PO) Take by mouth daily.   Yes [provider]  polyethylene glycol powder (GLYCOLAX/MIRALAX) 17 GM/SCOOP powder Take 17 g by mouth daily. Drink 17g (1 scoop) dissolved in water per day. 12/13/21  Yes Marguerita Beards, MD  Probiotic Product (PROBIOTIC-10 PO) Take by mouth.   Yes [provider]  NORITATE 1 % cream Apply topically daily. 09/27/21   [provider]    Allergies as of 06/11/2022 - Review Complete 06/08/2022  Allergen Reaction Noted   Sumatriptan Other (See Comments) 08/03/2021    Family History  Problem Relation Age of Onset   Hyperlipidemia Mother    Depression Father    Supraventricular tachycardia Father    Hyperlipidemia Father    Melanoma Father    Diabetes Maternal Grandmother    Skin cancer Paternal Grandfather    Breast cancer Neg Hx     Social History   Socioeconomic History   Marital status: Married    Spouse name: Not on file   Number of children: 2   Years of education: Not on file   Highest education level: Not on file  Occupational History   Occupation: Dealer  hygienist    Employer: [employer]  Tobacco Use   Smoking status: Never   Smokeless tobacco: Never  Vaping Use   Vaping Use: Never used  Substance and Sexual Activity   Alcohol use: Not Currently    Alcohol/week: 2.0 standard drinks of alcohol    Types: 2 Glasses of wine per week   Drug use: Never   Sexual activity: Yes    Partners: Male    Birth control/protection: Other-see comments    Comment: husband vasectomy  Other Topics Concern   Not on file  Social History Narrative   Not on file   Social Determinants of Health   Financial Resource Strain: Not on file  Food  Insecurity: Not on file  Transportation Needs: Not on file  Physical Activity: Not on file  Stress: Not on file  Social Connections: Not on file  Intimate Partner Violence: Not on file    Review of Systems: See HPI, otherwise negative ROS  Physical Exam: Ht  (1.651 m)   Wt 92.1 kg   LMP 08/29/2022 (Exact Date)   BMI 33.78 kg/m  General:   Alert,  pleasant and cooperative in NAD Head:  Normocephalic and atraumatic. Neck:  Supple; no masses or thyromegaly. Lungs:  Clear throughout to auscultation.    Heart:  Regular rate and rhythm. Abdomen:  Soft, nontender and nondistended. Normal bowel sounds, without guarding, and without rebound.   Neurologic:  Alert and  oriented x4;  grossly normal neurologically.  Impression/Plan: Robin Benton is now here to undergo a screening colonoscopy.  Risks, benefits, and alternatives regarding colonoscopy have been reviewed with the patient.  Questions have been answered.  All parties agreeable.

## 2022-09-14 NOTE — Anesthesia Postprocedure Evaluation (Signed)
Anesthesia Post Note  Patient: Robin Benton  Procedure(s) Performed: COLONOSCOPY WITH PROPOFOL (Rectum) POLYPECTOMY (Rectum)  Anesthesia Type: General Anesthetic complications: no   No notable events documented.   Last Vitals:  Vitals:   09/14/22 0810 09/14/22 0818  BP: 107/70 103/69  Pulse: 82   Resp: 18   Temp: 36.5 C (!) 36.4 C  SpO2: 95% 97%    Last Pain:  Vitals:   09/14/22 0818  TempSrc:   PainSc: 0-No pain                 Tessah Patchen C Lezly Rumpf

## 2022-09-14 NOTE — Transfer of Care (Signed)
Immediate Anesthesia Transfer of Care Note  Patient: Robin Benton  Procedure(s) Performed: COLONOSCOPY WITH PROPOFOL (Rectum) POLYPECTOMY (Rectum)  Patient Location: PACU  Anesthesia Type: General  Level of Consciousness: awake, alert  and patient cooperative  Airway and Oxygen Therapy: Patient Spontanous Breathing and Patient connected to supplemental oxygen  Post-op Assessment: Post-op Vital signs reviewed, Patient's Cardiovascular Status Stable, Respiratory Function Stable, Patent Airway and No signs of Nausea or vomiting  Post-op Vital Signs: Reviewed and stable  Complications: No notable events documented.

## 2022-09-17 ENCOUNTER — Encounter: Payer: Self-pay | Admitting: Gastroenterology

## 2022-09-18 ENCOUNTER — Encounter: Payer: Self-pay | Admitting: Gastroenterology

## 2022-09-18 ENCOUNTER — Other Ambulatory Visit: Payer: Self-pay | Admitting: Family

## 2022-09-18 DIAGNOSIS — Z1231 Encounter for screening mammogram for malignant neoplasm of breast: Secondary | ICD-10-CM

## 2022-09-18 LAB — SURGICAL PATHOLOGY

## 2022-09-27 ENCOUNTER — Ambulatory Visit: Payer: Commercial Managed Care - PPO | Admitting: Family

## 2022-10-02 ENCOUNTER — Encounter: Payer: Self-pay | Admitting: Family

## 2022-10-02 ENCOUNTER — Ambulatory Visit: Payer: Commercial Managed Care - PPO | Admitting: Family

## 2022-10-02 VITALS — BP 120/70 | HR 70 | Temp 98.5°F | Ht 65.0 in | Wt 192.8 lb

## 2022-10-02 DIAGNOSIS — R7989 Other specified abnormal findings of blood chemistry: Secondary | ICD-10-CM | POA: Diagnosis not present

## 2022-10-02 DIAGNOSIS — E7800 Pure hypercholesterolemia, unspecified: Secondary | ICD-10-CM | POA: Diagnosis not present

## 2022-10-02 LAB — CBC
HCT: 42 % (ref 36.0–46.0)
Hemoglobin: 14.1 g/dL (ref 12.0–15.0)
MCHC: 33.5 g/dL (ref 30.0–36.0)
MCV: 91.2 fl (ref 78.0–100.0)
Platelets: 425 10*3/uL — ABNORMAL HIGH (ref 150.0–400.0)
RBC: 4.6 Mil/uL (ref 3.87–5.11)
RDW: 13.2 % (ref 11.5–15.5)
WBC: 6 10*3/uL (ref 4.0–10.5)

## 2022-10-02 LAB — LIPID PANEL
Cholesterol: 212 mg/dL — ABNORMAL HIGH (ref 0–200)
HDL: 35.2 mg/dL — ABNORMAL LOW (ref >39.00)
NonHDL: 176.71
Total CHOL/HDL Ratio: 6
Triglycerides: 231 mg/dL — ABNORMAL HIGH (ref 0.0–149.0)
VLDL: 46.2 mg/dL — ABNORMAL HIGH (ref 0.0–40.0)

## 2022-10-02 LAB — LDL CHOLESTEROL, DIRECT: Direct LDL: 139 mg/dL

## 2022-10-02 NOTE — Progress Notes (Signed)
Established Patient Office Visit  Subjective:      CC:  Chief Complaint  Patient presents with   Medical Management of Chronic Issues    Fasting    HPI: Robin Benton is a 52 y.o. female presenting on 10/02/2022 for Medical Management of Chronic Issues (Fasting) . Rectal pressure from last visit has resolved. She was able to see her urogyn and was improved with valium and relaxation of the muscles.   HLD: . Has lost about 11 pounds since her last visit in January . She has been working on walking more and working on her diet and decreasing cholesterol.   Lab Results  Component Value Date   CHOL 233 (H) 06/08/2022   HDL 39.20 06/08/2022   LDLCALC 155 (H) 06/08/2022   TRIG 196.0 (H) 06/08/2022   CHOLHDL 6 06/08/2022   Wt Readings from Last 3 Encounters:  10/02/22 192 lb 12.8 oz (87.5 kg)  09/14/22 191 lb (86.6 kg)  06/08/22 203 lb (92.1 kg)            Social history:  Relevant past medical, surgical, family and social history reviewed and updated as indicated. Interim medical history since our last visit reviewed.  Allergies and medications reviewed and updated.  DATA REVIEWED: CHART IN EPIC     ROS: Negative unless specifically indicated above in HPI.    Current Outpatient Medications:    Cholecalciferol (VITAMIN D3 GUMMIES) 25 MCG (1000 UT) CHEW, Chew 2,000 Units by mouth., Disp: , Rfl:    Collagen-Vitamin C-Biotin (COLLAGEN 1500/C PO), Take by mouth., Disp: , Rfl:    Docusate Calcium (STOOL SOFTENER PO), Take by mouth daily., Disp: , Rfl:    esomeprazole (NEXIUM) 20 MG capsule, Take 20 mg by mouth daily at 12 noon., Disp: , Rfl:    Iron, Ferrous Sulfate, 325 (65 Fe) MG TABS, Take 325 mg by mouth daily., Disp: 30 tablet, Rfl: 2   Multiple Vitamin (MULTIVITAMIN) tablet, Take 1 tablet by mouth daily., Disp: , Rfl:    Multiple Vitamins-Minerals (HAIR SKIN NAILS PO), Take by mouth daily., Disp: , Rfl:    NORITATE 1 % cream, Apply topically daily., Disp: ,  Rfl:    polyethylene glycol powder (GLYCOLAX/MIRALAX) 17 GM/SCOOP powder, Take 17 g by mouth daily. Drink 17g (1 scoop) dissolved in water per day., Disp: 255 g, Rfl: 0   Probiotic Product (PROBIOTIC-10 PO), Take by mouth., Disp: , Rfl:       Objective:    BP 120/70 (BP Location: Left Arm)   Pulse 70   Temp 98.5 F (36.9 C) (Temporal)   Ht 5\' 5"  (1.651 m)   Wt 192 lb 12.8 oz (87.5 kg)   SpO2 98%   BMI 32.08 kg/m   Wt Readings from Last 3 Encounters:  10/02/22 192 lb 12.8 oz (87.5 kg)  09/14/22 191 lb (86.6 kg)  06/08/22 203 lb (92.1 kg)    Physical Exam Constitutional:      General: She is not in acute distress.    Appearance: Normal appearance. She is obese. She is not ill-appearing, toxic-appearing or diaphoretic.  HENT:     Head: Normocephalic.  Cardiovascular:     Rate and Rhythm: Normal rate.  Pulmonary:     Effort: Pulmonary effort is normal.  Musculoskeletal:        General: Normal range of motion.  Neurological:     General: No focal deficit present.     Mental Status: She is alert and oriented to person, place,  and time. Mental status is at baseline.  Psychiatric:        Mood and Affect: Mood normal.        Behavior: Behavior normal.        Thought Content: Thought content normal.        Judgment: Judgment normal.           Assessment & Plan:  Elevated LDL cholesterol level Assessment & Plan: Ordered lipid panel, pending results. Work on low cholesterol diet and exercise as tolerated   Orders: -     Lipid panel  Elevated platelet count -     CBC     Return in about 1 year (around 10/02/2023) for after 06/07/2023, f/u CPE.  Mort Sawyers, MSN, APRN, FNP-C [facility]

## 2022-10-02 NOTE — Assessment & Plan Note (Signed)
Ordered lipid panel, pending results. Work on low cholesterol diet and exercise as tolerated  

## 2022-10-31 ENCOUNTER — Ambulatory Visit
Admission: RE | Admit: 2022-10-31 | Discharge: 2022-10-31 | Disposition: A | Discharge status: Home / Self Care | Payer: Commercial Managed Care - PPO | Source: Ambulatory Visit | Attending: Family | Admitting: Family

## 2022-10-31 DIAGNOSIS — Z1231 Encounter for screening mammogram for malignant neoplasm of breast: Secondary | ICD-10-CM | POA: Diagnosis present

## 2023-04-04 ENCOUNTER — Ambulatory Visit
Admission: RE | Admit: 2023-04-04 | Discharge: 2023-04-04 | Disposition: A | Discharge status: Home / Self Care | Payer: Commercial Managed Care - PPO | Source: Ambulatory Visit | Attending: Emergency Medicine | Admitting: Emergency Medicine

## 2023-04-04 VITALS — BP 110/70 | HR 70 | Temp 98.5°F | Resp 17

## 2023-04-04 DIAGNOSIS — J0110 Acute frontal sinusitis, unspecified: Secondary | ICD-10-CM

## 2023-04-04 DIAGNOSIS — R300 Dysuria: Secondary | ICD-10-CM | POA: Diagnosis not present

## 2023-04-04 LAB — POCT URINALYSIS DIP (MANUAL ENTRY)
Bilirubin, UA: NEGATIVE
Glucose, UA: NEGATIVE mg/dL
Ketones, POC UA: NEGATIVE mg/dL
Leukocytes, UA: NEGATIVE
Nitrite, UA: NEGATIVE
Spec Grav, UA: 1.025 (ref 1.010–1.025)
Urobilinogen, UA: 0.2 U/dL
pH, UA: 5.5 (ref 5.0–8.0)

## 2023-04-04 MED ORDER — AMOXICILLIN-POT CLAVULANATE 875-125 MG PO TABS: 1.0000 | ORAL_TABLET | Freq: Two times a day (BID) | ORAL | 0 refills | Status: AC

## 2023-04-04 NOTE — ED Provider Notes (Signed)
Renaldo Fiddler    CSN: [id number] Arrival date & time: 04/04/23  4098      History   Chief Complaint Chief Complaint  Patient presents with   Headache    Bad, maybe sinus,  headache for two weeks behind my left eye. Will not go away. - Entered by patient   Facial Pain   Dysuria    HPI Sarrinah Rhoton is a 52 y.o. female.   Patient presents for evaluation of a constant left-sided frontal headache, mild intermittent bilateral ear fullness, sinus pressure behind the left eye, mild nasal congestion, mild productive cough present for 2 weeks.  Symptoms are exacerbated when bending over.  Cough exacerbates headache.  History of migraines but this headache feels different.  Has attempted use of heating and cold packs, pseudoephedrine, Tylenol and ibuprofen which has provided some relief but has not resolved symptoms.  No known sick contacts prior.  Tolerating food and liquids.  Over the past 2 days has experienced dysuria, urinary frequency and mild vaginal itching present for 2 days.  Has attempted use of cranberry juice.  Denies abdominal pain or pressure, flank pain, fevers, vaginal discharge or odor.     Past Medical History:  Diagnosis Date   Basal cell adenocarcinoma    facial area   Basal cell carcinoma 10/25/2017   Formatting of this note might be different from the original. glabella   Family history of adverse reaction to anesthesia    Father - PONV   GERD (gastroesophageal reflux disease)    Hyperlipidemia    Migraine headache    Motion sickness    Passenger in car   Other constipation 05/24/2020   PONV (postoperative nausea and vomiting)    after wisdom tooth procedure   Rosacea    Wears contact lenses     Patient Active Problem List   Diagnosis Date Noted   Polyp of transverse colon 09/14/2022   Menopausal and female climacteric states 06/08/2022   Elevated LDL cholesterol level 06/08/2022   Synovitis and tenosynovitis 05/01/2022   Rosacea 08/03/2021    Rosacea conjunctivitis of both eyes 08/03/2021   Functional urinary incontinence 08/03/2021   Iron deficiency anemia secondary to inadequate dietary iron intake 08/03/2021   Migraine with aura and without status migrainosus, not intractable 05/24/2020   Class 1 obesity due to excess calories without serious comorbidity with body mass index (BMI) of 32.0 to 32.9 in adult 01/09/2020   Vitamin D deficiency 01/09/2020   Gastroesophageal reflux disease 01/09/2020    Past Surgical History:  Procedure Laterality Date   COLONOSCOPY WITH PROPOFOL N/A 09/14/2022   Procedure: COLONOSCOPY WITH PROPOFOL;  Surgeon: Midge Minium, MD;  Location: [facility];  Service: Endoscopy;  Laterality: N/A;   MOHS SURGERY     POLYPECTOMY  09/14/2022   Procedure: POLYPECTOMY;  Surgeon: Midge Minium, MD;  Location: [facility];  Service: Endoscopy;;   RECTOCELE REPAIR N/A 12/25/2021   Procedure: POSTERIOR REPAIR (RECTOCELE);  Surgeon: Marguerita Beards, MD;  Location: [facility];  Service: Gynecology;  Laterality: N/A;   WISDOM TOOTH EXTRACTION      OB History     Gravida  2   Para  2   Term  1   Preterm  1   AB      Living  2      SAB      IAB      Ectopic      Multiple  Live Births  2            Home Medications    Prior to Admission medications   Medication Sig Start Date End Date Taking? Authorizing Provider  amoxicillin-clavulanate (AUGMENTIN) 875-125 MG tablet Take 1 tablet by mouth 2 (two) times daily for 10 days. 04/04/23 04/14/23 Yes Hamp Moreland R, NP  Cholecalciferol (VITAMIN D3 GUMMIES) 25 MCG (1000 UT) CHEW Chew 2,000 Units by mouth.    [provider]  Collagen-Vitamin C-Biotin (COLLAGEN 1500/C PO) Take by mouth.    [provider]  Docusate Calcium (STOOL SOFTENER PO) Take by mouth daily.    [provider]  esomeprazole (NEXIUM) 20 MG capsule Take 20 mg by mouth daily at 12 noon.    [provider]  Iron, Ferrous Sulfate, 325 (65 Fe) MG TABS Take 325 mg by mouth daily. 08/03/21   Mort Sawyers, FNP  Multiple Vitamin (MULTIVITAMIN) tablet Take 1 tablet by mouth daily.    [provider]  Multiple Vitamins-Minerals (HAIR SKIN NAILS PO) Take by mouth daily.    [provider]  NORITATE 1 % cream Apply topically daily. 09/27/21   [provider]  polyethylene glycol powder (GLYCOLAX/MIRALAX) 17 GM/SCOOP powder Take 17 g by mouth daily. Drink 17g (1 scoop) dissolved in water per day. 12/13/21   Marguerita Beards, MD  Probiotic Product (PROBIOTIC-10 PO) Take by mouth.    [provider]    Family History Family History  Problem Relation Age of Onset   Hyperlipidemia Mother    Depression Father    Supraventricular tachycardia Father    Hyperlipidemia Father    Melanoma Father    Diabetes Maternal Grandmother    Skin cancer Paternal Grandfather    Breast cancer Neg Hx     Social History Social History   Tobacco Use   Smoking status: Never   Smokeless tobacco: Never  Vaping Use   Vaping status: Never Used  Substance Use Topics   Alcohol use: Not Currently    Alcohol/week: 2.0 standard drinks of alcohol    Types: 2 Glasses of wine per week   Drug use: Never     Allergies   Sumatriptan   Review of Systems Review of Systems   Physical Exam Triage Vital Signs ED Triage Vitals  Encounter Vitals Group     BP 04/04/23 0910 110/70     Systolic BP Percentile --      Diastolic BP Percentile --      Pulse Rate 04/04/23 0910 70     Resp 04/04/23 0910 17     Temp 04/04/23 0910 98.5 F (36.9 C)     Temp Source 04/04/23 0910 Oral     SpO2 04/04/23 0910 94 %     Weight --      Height --      Head Circumference --      Peak Flow --      Pain Score 04/04/23 0913 3     Pain Loc --      Pain Education --      Exclude from Growth Chart --    No data found.  Updated Vital Signs BP 110/70 (BP Location: Left Arm)   Pulse  70   Temp 98.5 F (36.9 C) (Oral)   Resp 17   LMP 03/28/2023   SpO2 94%   Visual Acuity Right Eye Distance:   Left Eye Distance:   Bilateral Distance:    Right Eye Near:  Left Eye Near:    Bilateral Near:     Physical Exam Constitutional:      Appearance: Normal appearance.  HENT:     Head: Normocephalic.     Right Ear: Tympanic membrane, ear canal and external ear normal.     Left Ear: Tympanic membrane, ear canal and external ear normal.     Nose: Congestion present. No rhinorrhea.     Right Turbinates: Swollen.     Left Turbinates: Swollen.     Right Sinus: No maxillary sinus tenderness or frontal sinus tenderness.     Left Sinus: Frontal sinus tenderness present. No maxillary sinus tenderness.     Mouth/Throat:     Pharynx: Posterior oropharyngeal erythema present. No oropharyngeal exudate.  Eyes:     Extraocular Movements: Extraocular movements intact.  Cardiovascular:     Rate and Rhythm: Normal rate and regular rhythm.     Pulses: Normal pulses.     Heart sounds: Normal heart sounds.  Pulmonary:     Effort: Pulmonary effort is normal.     Breath sounds: Normal breath sounds.  Musculoskeletal:     Cervical back: Normal range of motion and neck supple.  Neurological:     Mental Status: She is alert and oriented to person, place, and time. Mental status is at baseline.      UC Treatments / Results  Labs (all labs ordered are listed, but only abnormal results are displayed) Labs Reviewed  POCT URINALYSIS DIP (MANUAL ENTRY) - Abnormal; Notable for the following components:      Result Value   Clarity, UA cloudy (*)    Blood, UA small (*)    Protein Ur, POC trace (*)    All other components within normal limits  URINE CULTURE    EKG   Radiology No results found.  Procedures Procedures (including critical care time)  Medications Ordered in UC Medications - No data to display  Initial Impression / Assessment and Plan / UC Course  I have  reviewed the triage vital signs and the nursing notes.  Pertinent labs & imaging results that were available during my care of the patient were reviewed by me and considered in my medical decision making (see chart for details).  Acute nonrecurrent frontal sinusitis, dysuria  Patient is in no signs of distress nor toxic appearing.  Vital signs are stable.  Low suspicion for pneumonia, pneumothorax or bronchitis and therefore will defer imaging.  Viral testing deferred due to timeline of illness.  Symptoms consistent with a sinusitis and as they have been present for 2 weeks we will provide bacterial coverage.  Augmentin sent to pharmacy.May use additional over-the-counter medications as needed for supportive care.  May follow-up with urgent care as needed if symptoms persist or worsen.  Urinalysis is negative, sent for culture, discussed findings with patient and advised to monitor, if symptoms continue to persist will follow-up with PCP urgent care for reevaluation Final Clinical Impressions(s) / UC Diagnoses   Final diagnoses:  Acute non-recurrent frontal sinusitis  Dysuria     Discharge Instructions      Today you are being treated for sinus infection  Begin Augmentin every morning and every evening for 10 days to clear bacteria which is causing symptoms to prolong    You can take Tylenol and/or Ibuprofen as needed for fever reduction and pain relief.   For cough: honey 1/2 to 1 teaspoon (you can dilute the honey in water or another fluid).  You can also use  guaifenesin and dextromethorphan for cough. You can use a humidifier for chest congestion and cough.  If you don't have a humidifier, you can sit in the bathroom with the hot shower running.      For sore throat: try warm salt water gargles, cepacol lozenges, throat spray, warm tea or water with lemon/honey, popsicles or ice, or OTC cold relief medicine for throat discomfort.   For congestion: take a daily anti-histamine like  Zyrtec, Claritin, and a oral decongestant, such as pseudoephedrine.  You can also use Flonase 1-2 sprays in each nostril daily.   It is important to stay hydrated: drink plenty of fluids (water, gatorade/powerade/pedialyte, juices, or teas) to keep your throat moisturized and help further relieve irritation/discomfort.  Your urinalysis does not show any signs of infection, your urine will be sent to the lab to determine exactly which bacteria is present, if any changes need to be made to your medications you will be notified  You may use over-the-counter Azo to help minimize your symptoms until antibiotic removes bacteria, this medication will turn your urine orange  Increase your fluid intake through use of water  As always practice good hygiene, wiping front to back and avoidance of scented vaginal products to prevent further irritation  If symptoms continue to persist after use of medication or recur please follow-up with urgent care or your primary doctor as needed     ED Prescriptions     Medication Sig Dispense Auth. Provider   amoxicillin-clavulanate (AUGMENTIN) 875-125 MG tablet Take 1 tablet by mouth 2 (two) times daily for 10 days. 20 tablet Valinda Hoar, NP      PDMP not reviewed this encounter.   Valinda Hoar, NP 04/04/23 1001

## 2023-04-04 NOTE — Discharge Instructions (Signed)
Today you are being treated for sinus infection  Begin Augmentin every morning and every evening for 10 days to clear bacteria which is causing symptoms to prolong    You can take Tylenol and/or Ibuprofen as needed for fever reduction and pain relief.   For cough: honey 1/2 to 1 teaspoon (you can dilute the honey in water or another fluid).  You can also use guaifenesin and dextromethorphan for cough. You can use a humidifier for chest congestion and cough.  If you don't have a humidifier, you can sit in the bathroom with the hot shower running.      For sore throat: try warm salt water gargles, cepacol lozenges, throat spray, warm tea or water with lemon/honey, popsicles or ice, or OTC cold relief medicine for throat discomfort.   For congestion: take a daily anti-histamine like Zyrtec, Claritin, and a oral decongestant, such as pseudoephedrine.  You can also use Flonase 1-2 sprays in each nostril daily.   It is important to stay hydrated: drink plenty of fluids (water, gatorade/powerade/pedialyte, juices, or teas) to keep your throat moisturized and help further relieve irritation/discomfort.  Your urinalysis does not show any signs of infection, your urine will be sent to the lab to determine exactly which bacteria is present, if any changes need to be made to your medications you will be notified  You may use over-the-counter Azo to help minimize your symptoms until antibiotic removes bacteria, this medication will turn your urine orange  Increase your fluid intake through use of water  As always practice good hygiene, wiping front to back and avoidance of scented vaginal products to prevent further irritation  If symptoms continue to persist after use of medication or recur please follow-up with urgent care or your primary doctor as needed

## 2023-04-04 NOTE — ED Triage Notes (Signed)
Pt presents with sinus pressure and headache and cough x 2 weeks.  Pt alos c/o dysuria x 2 days.

## 2023-04-06 LAB — URINE CULTURE: Culture: 10000 — AB

## 2023-04-08 ENCOUNTER — Telehealth (HOSPITAL_COMMUNITY): Payer: Self-pay | Admitting: Emergency Medicine

## 2023-04-08 MED ORDER — NITROFURANTOIN MONOHYD MACRO 100 MG PO CAPS: 100.0000 mg | ORAL_CAPSULE | Freq: Two times a day (BID) | ORAL | 0 refills | Status: DC

## 2023-04-08 MED ORDER — FLUCONAZOLE 150 MG PO TABS: 150.0000 mg | ORAL_TABLET | Freq: Once | ORAL | 0 refills | Status: AC

## 2023-04-08 NOTE — Telephone Encounter (Signed)
Diflucan and Macrobid for results of urine culture

## 2023-06-11 ENCOUNTER — Ambulatory Visit (INDEPENDENT_AMBULATORY_CARE_PROVIDER_SITE_OTHER): Payer: Commercial Managed Care - PPO | Admitting: Family

## 2023-06-11 ENCOUNTER — Encounter: Payer: Self-pay | Admitting: Family

## 2023-06-11 VITALS — BP 110/72 | HR 56 | Temp 98.8°F | Ht 65.0 in | Wt 198.2 lb

## 2023-06-11 DIAGNOSIS — E7800 Pure hypercholesterolemia, unspecified: Secondary | ICD-10-CM

## 2023-06-11 DIAGNOSIS — R635 Abnormal weight gain: Secondary | ICD-10-CM | POA: Insufficient documentation

## 2023-06-11 DIAGNOSIS — E66811 Obesity, class 1: Secondary | ICD-10-CM

## 2023-06-11 DIAGNOSIS — Z6832 Body mass index (BMI) 32.0-32.9, adult: Secondary | ICD-10-CM

## 2023-06-11 DIAGNOSIS — J3089 Other allergic rhinitis: Secondary | ICD-10-CM | POA: Insufficient documentation

## 2023-06-11 DIAGNOSIS — E559 Vitamin D deficiency, unspecified: Secondary | ICD-10-CM

## 2023-06-11 DIAGNOSIS — K219 Gastro-esophageal reflux disease without esophagitis: Secondary | ICD-10-CM | POA: Diagnosis not present

## 2023-06-11 DIAGNOSIS — Z0001 Encounter for general adult medical examination with abnormal findings: Secondary | ICD-10-CM | POA: Diagnosis not present

## 2023-06-11 DIAGNOSIS — Z5181 Encounter for therapeutic drug level monitoring: Secondary | ICD-10-CM | POA: Insufficient documentation

## 2023-06-11 DIAGNOSIS — D508 Other iron deficiency anemias: Secondary | ICD-10-CM | POA: Diagnosis not present

## 2023-06-11 DIAGNOSIS — Z79899 Other long term (current) drug therapy: Secondary | ICD-10-CM

## 2023-06-11 DIAGNOSIS — N951 Menopausal and female climacteric states: Secondary | ICD-10-CM

## 2023-06-11 DIAGNOSIS — Z23 Encounter for immunization: Secondary | ICD-10-CM | POA: Diagnosis not present

## 2023-06-11 DIAGNOSIS — R053 Chronic cough: Secondary | ICD-10-CM | POA: Insufficient documentation

## 2023-06-11 LAB — CBC WITH DIFFERENTIAL/PLATELET
Basophils Absolute: 0 10*3/uL (ref 0.0–0.1)
Basophils Relative: 0.6 % (ref 0.0–3.0)
Eosinophils Absolute: 0.3 10*3/uL (ref 0.0–0.7)
Eosinophils Relative: 3.9 % (ref 0.0–5.0)
HCT: 44.2 % (ref 36.0–46.0)
Hemoglobin: 14.7 g/dL (ref 12.0–15.0)
Lymphocytes Relative: 30.2 % (ref 12.0–46.0)
Lymphs Abs: 2.1 10*3/uL (ref 0.7–4.0)
MCHC: 33.2 g/dL (ref 30.0–36.0)
MCV: 92.9 fL (ref 78.0–100.0)
Monocytes Absolute: 0.7 10*3/uL (ref 0.1–1.0)
Monocytes Relative: 10.3 % (ref 3.0–12.0)
Neutro Abs: 3.7 10*3/uL (ref 1.4–7.7)
Neutrophils Relative %: 55 % (ref 43.0–77.0)
Platelets: 412 10*3/uL — ABNORMAL HIGH (ref 150.0–400.0)
RBC: 4.76 Mil/uL (ref 3.87–5.11)
RDW: 13.6 % (ref 11.5–15.5)
WBC: 6.8 10*3/uL (ref 4.0–10.5)

## 2023-06-11 LAB — IBC + FERRITIN
Ferritin: 121.8 ng/mL (ref 10.0–291.0)
Iron: 91 ug/dL (ref 42–145)
Saturation Ratios: 29.3 % (ref 20.0–50.0)
TIBC: 310.8 ug/dL (ref 250.0–450.0)
Transferrin: 222 mg/dL (ref 212.0–360.0)

## 2023-06-11 LAB — BASIC METABOLIC PANEL
BUN: 18 mg/dL (ref 6–23)
CO2: 27 meq/L (ref 19–32)
Calcium: 9.7 mg/dL (ref 8.4–10.5)
Chloride: 103 meq/L (ref 96–112)
Creatinine, Ser: 0.85 mg/dL (ref 0.40–1.20)
GFR: 79.92 mL/min (ref >60.00)
Glucose, Bld: 88 mg/dL (ref 70–99)
Potassium: 4.4 meq/L (ref 3.5–5.1)
Sodium: 138 meq/L (ref 135–145)

## 2023-06-11 LAB — LIPID PANEL
Cholesterol: 290 mg/dL — ABNORMAL HIGH (ref 0–200)
HDL: 36.1 mg/dL — ABNORMAL LOW (ref >39.00)
LDL Cholesterol: 198 mg/dL — ABNORMAL HIGH (ref 0–99)
NonHDL: 254
Total CHOL/HDL Ratio: 8
Triglycerides: 281 mg/dL — ABNORMAL HIGH (ref 0.0–149.0)
VLDL: 56.2 mg/dL — ABNORMAL HIGH (ref 0.0–40.0)

## 2023-06-11 LAB — TSH: TSH: 3.18 u[IU]/mL (ref 0.35–5.50)

## 2023-06-11 LAB — VITAMIN D 25 HYDROXY (VIT D DEFICIENCY, FRACTURES): VITD: 77.56 ng/mL (ref 30.00–100.00)

## 2023-06-11 LAB — T4, FREE: Free T4: 0.95 ng/dL (ref 0.60–1.60)

## 2023-06-11 NOTE — Assessment & Plan Note (Signed)
 Advised normal for weight fluctuations however we will assess thyroid panel to review further pending results.

## 2023-06-11 NOTE — Patient Instructions (Signed)
 ------------------------------------  Recommend daily flonase and also zyrtec at night for allergies.   ------------------------------------

## 2023-06-11 NOTE — Assessment & Plan Note (Signed)
 Pt advised to work on diet and exercise as tolerated May consider Glp 1 pending labs to r/o causes of weight gain.

## 2023-06-11 NOTE — Assessment & Plan Note (Signed)

## 2023-06-11 NOTE — Progress Notes (Signed)
 Subjective:  Patient ID: Robin Benton, female    DOB: November 08, 1970  Age: 53 y.o. MRN: [id number]  Patient Care Team: Corwin Antu, FNP as PCP - General (Family Medicine)   CC:  Chief Complaint  Patient presents with   Annual Exam    HPI Robin Benton is a 53 y.o. female who presents today for an annual physical exam. She reports consuming a general diet.  Exercise includes walking between 20-30 minutes briskly.  She generally feels well. She reports sleeping well. She does have additional problems to discuss today.   Vision:Within last year Dental:Receives regular dental care  Mammogram: 10/31/22 Last pap: 06/08/22 Colonoscopy: 09/14/22  Pt is with acute concerns.  Hard to lose weight, states that she exercises daily, daily walks. She doesn't eat very much throughout the day. Last period was last 03/2022.   Has been taking cough and congestion otc over the last few months. She states it seems to provide her with slight relief. She is also sneezing a bit throughout the day. Clearing her throat often and a dry non productive cough. She doesn't necessarily feel heartburn, she is on long term nexium. She does feel this more at night time.   Advanced Directives Patient does not have advanced directives    DEPRESSION SCREENING    06/11/2023    8:34 AM 10/02/2022   11:00 AM 06/08/2022    8:59 AM  PHQ 2/9 Scores  PHQ - 2 Score 0 0 0  PHQ- 9 Score 0  2     ROS: Negative unless specifically indicated above in HPI.    Current Outpatient Medications:    Cholecalciferol (VITAMIN D3 GUMMIES) 25 MCG (1000 UT) CHEW, Chew 2,000 Units by mouth., Disp: , Rfl:    Collagen-Vitamin C-Biotin (COLLAGEN 1500/C PO), Take by mouth., Disp: , Rfl:    Docusate Calcium (STOOL SOFTENER PO), Take by mouth daily., Disp: , Rfl:    esomeprazole (NEXIUM) 20 MG capsule, Take 20 mg by mouth daily at 12 noon., Disp: , Rfl:    Iron , Ferrous Sulfate , 325 (65 Fe) MG TABS, Take 325 mg by mouth daily., Disp: 30 tablet, Rfl:  2   Multiple Vitamin (MULTIVITAMIN) tablet, Take 1 tablet by mouth daily., Disp: , Rfl:    Multiple Vitamins-Minerals (HAIR SKIN NAILS PO), Take by mouth daily., Disp: , Rfl:     Objective:    BP 110/72 (BP Location: Left Arm, Patient Position: Sitting, Cuff Size: Normal)   Pulse (!) 56   Temp 98.8 F (37.1 C) (Temporal)   Ht 5' 5 (1.651 m)   Wt 198 lb 3.2 oz (89.9 kg)   SpO2 98%   BMI 32.98 kg/m   BP Readings from Last 3 Encounters:  06/11/23 110/72  04/04/23 110/70  10/02/22 120/70      Physical Exam Constitutional:      General: She is not in acute distress.    Appearance: Normal appearance. She is normal weight. She is not ill-appearing.  HENT:     Head: Normocephalic.     Right Ear: Tympanic membrane normal.     Left Ear: Tympanic membrane normal.     Nose: Nose normal.     Mouth/Throat:     Mouth: Mucous membranes are moist.  Eyes:     Extraocular Movements: Extraocular movements intact.     Pupils: Pupils are equal, round, and reactive to light.  Cardiovascular:     Rate and Rhythm: Normal rate and regular rhythm.  Pulmonary:  Effort: Pulmonary effort is normal.     Breath sounds: Normal breath sounds.  Abdominal:     General: Abdomen is flat. Bowel sounds are normal.     Palpations: Abdomen is soft.     Tenderness: There is no guarding or rebound.  Musculoskeletal:        General: Normal range of motion.     Cervical back: Normal range of motion.  Skin:    General: Skin is warm.     Capillary Refill: Capillary refill takes less than 2 seconds.  Neurological:     General: No focal deficit present.     Mental Status: She is alert.  Psychiatric:        Mood and Affect: Mood normal.        Behavior: Behavior normal.        Thought Content: Thought content normal.        Judgment: Judgment normal.          Assessment & Plan:  Need for influenza vaccination -     Flu vaccine trivalent PF, 6mos and  older(Flulaval,Afluria,Fluarix,Fluzone)  Encounter for monitoring long-term proton pump inhibitor therapy  Chronic cough  Non-seasonal allergic rhinitis, unspecified trigger Assessment & Plan: Suspected cause of subacute cough  Stop otc medication  Start flonase and nightly zyrtec    Orders: -     CBC with Differential/Platelet  Iron  deficiency anemia secondary to inadequate dietary iron  intake -     CBC with Differential/Platelet -     IBC + Ferritin; Future  Class 1 obesity due to excess calories without serious comorbidity with body mass index (BMI) of 32.0 to 32.9 in adult Assessment & Plan: Pt advised to work on diet and exercise as tolerated May consider Glp 1 pending labs to r/o causes of weight gain.    Gastroesophageal reflux disease, unspecified whether esophagitis present Assessment & Plan: Long term ppi use discussed with patient.  Try to decrease and or avoid spicy foods, fried fatty foods, and also caffeine and chocolate as these can increase heartburn symptoms.  We will discuss at f/u if this needs to be changed and or if we can start the weaning process   Vitamin D  deficiency Assessment & Plan: Ordered vitamin d  pending results.    Orders: -     VITAMIN D  25 Hydroxy (Vit-D Deficiency, Fractures)  Elevated LDL cholesterol level Assessment & Plan: Ordered lipid panel, pending results. Work on low cholesterol diet and exercise as tolerated Pt open to statin pending lab results.  Family history hyperlipidemia, will also order lpa   Orders: -     Lipid panel -     Lipoprotein A (LPA)  Menopausal and female climacteric states Assessment & Plan: Advised normal for weight fluctuations however we will assess thyroid  panel to review further pending results.    Weight gain, abnormal -     TSH -     T4, free  Encounter for general adult medical examination with abnormal findings Assessment & Plan: Patient Counseling(The following topics were  reviewed):  Preventative care handout given to pt  Health maintenance and immunizations reviewed. Please refer to Health maintenance section. Pt advised on safe sex, wearing seatbelts in car, and proper nutrition labwork ordered today for annual Dental health: Discussed importance of regular tooth brushing, flossing, and dental visits.   Orders: -     Lipid panel -     Basic metabolic panel -     TSH  Time in flinic spent  for additional concerns totaled 15 minutes    Follow-up: Return in about 3 weeks (around 07/02/2023) for f/u allergic rhinitis.   Ginger Patrick, FNP

## 2023-06-11 NOTE — Assessment & Plan Note (Signed)
 Ordered vitamin d pending results.

## 2023-06-11 NOTE — Assessment & Plan Note (Signed)
 Long term ppi use discussed with patient.  Try to decrease and or avoid spicy foods, fried fatty foods, and also caffeine and chocolate as these can increase heartburn symptoms.  We will discuss at f/u if this needs to be changed and or if we can start the weaning process

## 2023-06-11 NOTE — Assessment & Plan Note (Signed)
 Ordered lipid panel, pending results. Work on low cholesterol diet and exercise as tolerated Pt open to statin pending lab results.  Family history hyperlipidemia, will also order lpa

## 2023-06-11 NOTE — Assessment & Plan Note (Signed)
 Suspected cause of subacute cough  Stop otc medication  Start flonase and nightly zyrtec

## 2023-06-12 ENCOUNTER — Ambulatory Visit: Payer: Commercial Managed Care - PPO

## 2023-06-12 ENCOUNTER — Other Ambulatory Visit: Payer: Self-pay | Admitting: Family

## 2023-06-12 DIAGNOSIS — E782 Mixed hyperlipidemia: Secondary | ICD-10-CM

## 2023-06-12 DIAGNOSIS — R7989 Other specified abnormal findings of blood chemistry: Secondary | ICD-10-CM

## 2023-06-12 MED ORDER — PRAVASTATIN SODIUM 10 MG PO TABS: 10.0000 mg | ORAL_TABLET | Freq: Every day | ORAL | 3 refills | Status: DC

## 2023-06-12 NOTE — Progress Notes (Signed)
 Are we able to add on hgn electrophoresis? Orders in

## 2023-06-15 LAB — LIPOPROTEIN A (LPA): Lipoprotein (a): 14 nmol/L (ref <75)

## 2023-06-16 LAB — HGB FRACTIONATION CASCADE
Hgb A2: 2.9 % (ref 1.8–3.2)
Hgb A: 97.1 % (ref 96.4–98.8)
Hgb F: 0 % (ref 0.0–2.0)
Hgb S: 0 %

## 2023-07-02 ENCOUNTER — Encounter: Payer: Self-pay | Admitting: Family

## 2023-07-02 ENCOUNTER — Ambulatory Visit: Payer: Commercial Managed Care - PPO | Admitting: Family

## 2023-07-02 VITALS — BP 102/62 | HR 76 | Temp 98.6°F | Ht 65.0 in | Wt 198.2 lb

## 2023-07-02 DIAGNOSIS — Z79899 Other long term (current) drug therapy: Secondary | ICD-10-CM | POA: Insufficient documentation

## 2023-07-02 DIAGNOSIS — R7989 Other specified abnormal findings of blood chemistry: Secondary | ICD-10-CM | POA: Diagnosis not present

## 2023-07-02 DIAGNOSIS — J3089 Other allergic rhinitis: Secondary | ICD-10-CM

## 2023-07-02 DIAGNOSIS — Z860100 Personal history of colon polyps, unspecified: Secondary | ICD-10-CM | POA: Insufficient documentation

## 2023-07-02 DIAGNOSIS — E559 Vitamin D deficiency, unspecified: Secondary | ICD-10-CM

## 2023-07-02 DIAGNOSIS — K219 Gastro-esophageal reflux disease without esophagitis: Secondary | ICD-10-CM

## 2023-07-02 MED ORDER — FAMOTIDINE 10 MG PO TABS
10.0000 mg | ORAL_TABLET | Freq: Two times a day (BID) | ORAL | 0 refills | Status: AC
Start: 2023-07-02 — End: ?

## 2023-07-02 NOTE — Assessment & Plan Note (Signed)
Trial stop nexium  Start pepcid if needed Try to decrease and or avoid spicy foods, fried fatty foods, and also caffeine and chocolate as these can increase heartburn symptoms.  If unable to come off of nexium even with food changes /interventions will consider GI evaluation for possible EGD

## 2023-07-02 NOTE — Patient Instructions (Signed)
Marland Kitchen

## 2023-07-02 NOTE — Progress Notes (Signed)
Established Patient Office Visit  Subjective:   Patient ID: Robin Benton, female    DOB: 07/06/1970  Age: 53 y.o. MRN: [id number]  CC:  Chief Complaint  Patient presents with   Medical Management of Chronic Issues    HPI: Robin Benton is a 53 y.o. female presenting on 07/02/2023 for Medical Management of Chronic Issues  Allergy symptoms, was seen 1/7 for cough and congestion that was ongoing for the last few months. She states since starting flonase and also zyrtec she is having great improvement, doesn't have the daily nagging cough.   GERD: has been on nexium for years. Has never had an EKG. She did stop nexium x one day and she had heart burn. She does like spaghetti but this is only 2-3 times a month. She does drink coffee, started over the last five years.        ROS: Negative unless specifically indicated above in HPI.   Relevant past medical history reviewed and updated as indicated.   Allergies and medications reviewed and updated.   Current Outpatient Medications:    Collagen-Vitamin C-Biotin (COLLAGEN 1500/C PO), Take by mouth., Disp: , Rfl:    Docusate Calcium (STOOL SOFTENER PO), Take by mouth daily., Disp: , Rfl:    esomeprazole (NEXIUM) 20 MG capsule, Take 20 mg by mouth daily at 12 noon., Disp: , Rfl:    famotidine (PEPCID AC) 10 MG tablet, Take 1 tablet (10 mg total) by mouth 2 (two) times daily., Disp: 180 tablet, Rfl: 0   Iron, Ferrous Sulfate, 325 (65 Fe) MG TABS, Take 325 mg by mouth daily., Disp: 30 tablet, Rfl: 2   Multiple Vitamins-Minerals (HAIR SKIN NAILS PO), Take by mouth daily., Disp: , Rfl:    pravastatin (PRAVACHOL) 10 MG tablet, Take 1 tablet (10 mg total) by mouth daily., Disp: 90 tablet, Rfl: 3   UNABLE TO FIND, Take 4 tablets by mouth daily. Med Name: Nutrafol, Disp: , Rfl:    Vitamin D-Vitamin K (VITAMIN K2-VITAMIN D3 PO), Take 1 tablet by mouth daily., Disp: , Rfl:   Allergies  Allergen Reactions   Sumatriptan Other (See Comments)    Made her  feel weird   Atorvastatin Other (See Comments)    myalgia    Objective:   BP 102/62 (BP Location: Left Arm, Patient Position: Sitting, Cuff Size: Normal)   Pulse 76   Temp 98.6 F (37 C) (Temporal)   Ht 5\' 5"  (1.651 m)   Wt 198 lb 3.2 oz (89.9 kg)   SpO2 96%   BMI 32.98 kg/m    Physical Exam Constitutional:      General: She is not in acute distress.    Appearance: Normal appearance. She is normal weight. She is not ill-appearing, toxic-appearing or diaphoretic.  HENT:     Head: Normocephalic.  Cardiovascular:     Rate and Rhythm: Normal rate.  Pulmonary:     Effort: Pulmonary effort is normal.  Musculoskeletal:        General: Normal range of motion.  Neurological:     General: No focal deficit present.     Mental Status: She is alert and oriented to person, place, and time. Mental status is at baseline.  Psychiatric:        Mood and Affect: Mood normal.        Behavior: Behavior normal.        Thought Content: Thought content normal.        Judgment: Judgment normal.  Assessment & Plan:  Gastroesophageal reflux disease, unspecified whether esophagitis present Assessment & Plan: Trial stop nexium  Start pepcid if needed Try to decrease and or avoid spicy foods, fried fatty foods, and also caffeine and chocolate as these can increase heartburn symptoms.  If unable to come off of nexium even with food changes /interventions will consider GI evaluation for possible EGD   Orders: -     Famotidine; Take 1 tablet (10 mg total) by mouth 2 (two) times daily.  Dispense: 180 tablet; Refill: 0  Vitamin D deficiency -     VITAMIN D 25 Hydroxy (Vit-D Deficiency, Fractures); Future  High platelet count -     CBC; Future  Long-term current use of proton pump inhibitor therapy  History of colon polyps  Non-seasonal allergic rhinitis, unspecified trigger Assessment & Plan: Much improved Cont flonase and zyrtec       Follow up plan: Return in about 6 months  (around 12/30/2023) for f/u cholesterol.  Mort Sawyers, FNP

## 2023-07-02 NOTE — Assessment & Plan Note (Signed)
Much improved Cont flonase and zyrtec

## 2023-10-01 ENCOUNTER — Encounter: Payer: Self-pay | Admitting: Family

## 2023-10-01 ENCOUNTER — Other Ambulatory Visit (INDEPENDENT_AMBULATORY_CARE_PROVIDER_SITE_OTHER): Payer: Commercial Managed Care - PPO

## 2023-10-01 DIAGNOSIS — E782 Mixed hyperlipidemia: Secondary | ICD-10-CM

## 2023-10-01 DIAGNOSIS — R7989 Other specified abnormal findings of blood chemistry: Secondary | ICD-10-CM

## 2023-10-01 DIAGNOSIS — D75839 Thrombocytosis, unspecified: Secondary | ICD-10-CM

## 2023-10-01 DIAGNOSIS — E559 Vitamin D deficiency, unspecified: Secondary | ICD-10-CM | POA: Diagnosis not present

## 2023-10-01 LAB — LIPID PANEL
Cholesterol: 179 mg/dL (ref 0–200)
HDL: 31.8 mg/dL — ABNORMAL LOW (ref >39.00)
LDL Cholesterol: 102 mg/dL — ABNORMAL HIGH (ref 0–99)
NonHDL: 147.16
Total CHOL/HDL Ratio: 6
Triglycerides: 226 mg/dL — ABNORMAL HIGH (ref 0.0–149.0)
VLDL: 45.2 mg/dL — ABNORMAL HIGH (ref 0.0–40.0)

## 2023-10-01 LAB — CBC
HCT: 42.2 % (ref 36.0–46.0)
Hemoglobin: 14.1 g/dL (ref 12.0–15.0)
MCHC: 33.4 g/dL (ref 30.0–36.0)
MCV: 92.4 fl (ref 78.0–100.0)
Platelets: 410 10*3/uL — ABNORMAL HIGH (ref 150.0–400.0)
RBC: 4.57 Mil/uL (ref 3.87–5.11)
RDW: 13.5 % (ref 11.5–15.5)
WBC: 7.5 10*3/uL (ref 4.0–10.5)

## 2023-10-01 LAB — VITAMIN D 25 HYDROXY (VIT D DEFICIENCY, FRACTURES): VITD: 52.67 ng/mL (ref 30.00–100.00)

## 2023-10-30 ENCOUNTER — Inpatient Hospital Stay: Attending: Oncology | Admitting: Oncology

## 2023-10-30 ENCOUNTER — Inpatient Hospital Stay

## 2023-10-30 VITALS — BP 102/58 | HR 64 | Temp 98.4°F | Resp 17 | Ht 65.0 in | Wt 196.4 lb

## 2023-10-30 DIAGNOSIS — D75839 Thrombocytosis, unspecified: Secondary | ICD-10-CM | POA: Insufficient documentation

## 2023-10-30 DIAGNOSIS — Z79620 Long term (current) use of immunosuppressive biologic: Secondary | ICD-10-CM | POA: Insufficient documentation

## 2023-10-30 DIAGNOSIS — E611 Iron deficiency: Secondary | ICD-10-CM | POA: Insufficient documentation

## 2023-10-30 DIAGNOSIS — Z85828 Personal history of other malignant neoplasm of skin: Secondary | ICD-10-CM | POA: Diagnosis not present

## 2023-10-30 DIAGNOSIS — Z79899 Other long term (current) drug therapy: Secondary | ICD-10-CM | POA: Insufficient documentation

## 2023-10-30 DIAGNOSIS — Z87440 Personal history of urinary (tract) infections: Secondary | ICD-10-CM | POA: Diagnosis not present

## 2023-10-30 LAB — FERRITIN: Ferritin: 221 ng/mL (ref 11–307)

## 2023-10-30 LAB — CBC WITH DIFFERENTIAL (CANCER CENTER ONLY)
Abs Immature Granulocytes: 0.01 10*3/uL (ref 0.00–0.07)
Basophils Absolute: 0 10*3/uL (ref 0.0–0.1)
Basophils Relative: 1 %
Eosinophils Absolute: 0.2 10*3/uL (ref 0.0–0.5)
Eosinophils Relative: 4 %
HCT: 41.1 % (ref 36.0–46.0)
Hemoglobin: 14.1 g/dL (ref 12.0–15.0)
Immature Granulocytes: 0 %
Lymphocytes Relative: 30 %
Lymphs Abs: 1.7 10*3/uL (ref 0.7–4.0)
MCH: 30.5 pg (ref 26.0–34.0)
MCHC: 34.3 g/dL (ref 30.0–36.0)
MCV: 89 fL (ref 80.0–100.0)
Monocytes Absolute: 0.5 10*3/uL (ref 0.1–1.0)
Monocytes Relative: 9 %
Neutro Abs: 3.1 10*3/uL (ref 1.7–7.7)
Neutrophils Relative %: 56 %
Platelet Count: 402 10*3/uL — ABNORMAL HIGH (ref 150–400)
RBC: 4.62 MIL/uL (ref 3.87–5.11)
RDW: 13.1 % (ref 11.5–15.5)
WBC Count: 5.5 10*3/uL (ref 4.0–10.5)
nRBC: 0 % (ref 0.0–0.2)

## 2023-10-30 LAB — C-REACTIVE PROTEIN: CRP: <0.5 mg/dL (ref <1.0)

## 2023-10-30 LAB — CMP (CANCER CENTER ONLY)
ALT: 10 U/L (ref 0–44)
AST: 12 U/L — ABNORMAL LOW (ref 15–41)
Albumin: 4.8 g/dL (ref 3.5–5.0)
Alkaline Phosphatase: 76 U/L (ref 38–126)
Anion gap: 7 (ref 5–15)
BUN: 12 mg/dL (ref 6–20)
CO2: 27 mmol/L (ref 22–32)
Calcium: 9.4 mg/dL (ref 8.9–10.3)
Chloride: 106 mmol/L (ref 98–111)
Creatinine: 0.88 mg/dL (ref 0.44–1.00)
GFR, Estimated: >60 mL/min (ref >60)
Glucose, Bld: 90 mg/dL (ref 70–99)
Potassium: 4.1 mmol/L (ref 3.5–5.1)
Sodium: 140 mmol/L (ref 135–145)
Total Bilirubin: 0.5 mg/dL (ref 0.0–1.2)
Total Protein: 7.5 g/dL (ref 6.5–8.1)

## 2023-10-30 LAB — TSH: TSH: 3.02 u[IU]/mL (ref 0.350–4.500)

## 2023-10-30 LAB — IRON AND IRON BINDING CAPACITY (CC-WL,HP ONLY)
Iron: 100 ug/dL (ref 28–170)
Saturation Ratios: 32 % — ABNORMAL HIGH (ref 10.4–31.8)
TIBC: 312 ug/dL (ref 250–450)
UIBC: 212 ug/dL (ref 148–442)

## 2023-10-30 LAB — SEDIMENTATION RATE: Sed Rate: 6 mm/h (ref 0–22)

## 2023-10-30 NOTE — Progress Notes (Unsigned)
 [facility]  HEMATOLOGY CLINIC CONSULTATION NOTE  PATIENT NAME: Robin Benton   MR#: [id number] DOB: 1970/06/28  DATE OF SERVICE: 10/30/2023  Patient Care Team: Felicita Horns, FNP as PCP - General (Family Medicine)  REASON FOR CONSULTATION/ CHIEF COMPLAINT:  Thrombocytosis  ASSESSMENT & PLAN:  Robin Benton is a 53 y.o. lady with a past medical history of basal cell skin cancer, dyslipidemia, GERD, migraines, was referred to our service for evaluation of thrombocytosis.  Thrombocytosis Mild thrombocytosis with platelet counts ranging from 408,000 to 460,000, slightly above the normal range.  Possible causes include iron  deficiency, inflammation, or infection. No significant risk of clotting at current levels.   Bone marrow mutation is unlikely given the mild degree of thrombocytosis and normal white and red blood cell counts. Chronic inflammation from foot injury and recurrent UTIs may contribute to elevated platelet levels.  Labs today showed platelet count of 402,000, nearly normal.  White count 4000.8 with normal differential.  Hemoglobin normal at 14.1.  CMP, iron  studies, ESR, CRP, TSH are all within normal limits.  Given fluctuating platelet count, we did check for JAK2 mutation analysis with reflex testing, although clinical picture does not seem to suggest primary myeloproliferative neoplasm.  - Schedule phone visit in two weeks to discuss test results  - Schedule follow-up visit in six months.  She was provided reassurance.  Chronic urinary tract infections Recurrent urinary tract infections, occurring several times a year. Last episode was a few months ago. These infections may contribute to elevated platelet levels.  History of Iron  deficiency Previous low iron  levels. Currently taking over-the-counter iron  supplements, reduced from two to one pill daily due to stomach discomfort. Iron  levels have improved but not yet optimal for hair health. - Continue  current iron  supplementation   I reviewed lab results and outside records for this visit and discussed relevant results with the patient. Diagnosis, plan of care and treatment options were also discussed in detail with the patient. Opportunity provided to ask questions and answers provided to her apparent satisfaction. Provided instructions to call our clinic with any problems, questions or concerns prior to return visit. I recommended to continue follow-up with PCP and sub-specialists. She verbalized understanding and agreed with the plan. No barriers to learning was detected.  Maryetta Shafer, MD   [facility] - A DEPT OF [facility] [address] [place] [address] Dept: [phone] Dept Fax: [phone]   I spent a total of 55 minutes during this encounter with the patient including review of chart and various tests results, discussions about plan of care and coordination of care plan.  HISTORY OF PRESENTING ILLNESS:   Discussed the use of AI scribe software for clinical note transcription with the patient, who gave verbal consent to proceed.  History of Present Illness Robin Benton is a 53 year old female who presents with elevated platelet count.   On 10/01/2023, labs at her PCPs office showed platelet count of 410,000.  Previously labs in January 2025 showed platelet count of 412,000.  Given slightly increased platelet count, referral was sent to us  for further evaluation.  She has a history of elevated platelet count since August 2021, with levels ranging from 408,000 to 460,000, the highest recorded in January 2024. She has a history of iron  deficiency and is currently taking over-the-counter iron  supplements once daily, having reduced from twice daily due to stomach discomfort. Her iron  level was  previously at 20 and has increased to about 50 with supplementation.  She experienced a foot injury two years ago,  resulting in unresolved blood and daily swelling and pain in her left foot. An MRI showed no fractures but indicated retained blood. The foot continues to swell daily, though less than initially.  She is perimenopausal, having had only three menstrual cycles since October 2024, with a history of heavy menstrual cycles.  She experiences chronic urinary tract infections, with several occurrences each year, the most recent being a few months ago. She also has a history of bronchitis, which she experienced frequently during childhood and had a few years ago.  No history of spleen surgery, trauma, or thyroid  issues. She does not smoke and works part-time as a Armed forces operational officer at [employer] and fills in at [employer] as needed.   MEDICAL HISTORY:  Past Medical History:  Diagnosis Date   Basal cell adenocarcinoma    facial area   Basal cell carcinoma 10/25/2017   Formatting of this note might be different from the original. glabella   Family history of adverse reaction to anesthesia    Father - PONV   GERD (gastroesophageal reflux disease)    Hyperlipidemia    Migraine headache    Motion sickness    Passenger in car   Other constipation 05/24/2020   PONV (postoperative nausea and vomiting)    after wisdom tooth procedure   Rosacea    Wears contact lenses     SURGICAL HISTORY: Past Surgical History:  Procedure Laterality Date   COLONOSCOPY WITH PROPOFOL  N/A 09/14/2022   Procedure: COLONOSCOPY WITH PROPOFOL ;  Surgeon: Marnee Sink, MD;  Location: [facility];  Service: Endoscopy;  Laterality: N/A;   MOHS SURGERY     POLYPECTOMY  09/14/2022   Procedure: POLYPECTOMY;  Surgeon: Marnee Sink, MD;  Location: [facility];  Service: Endoscopy;;   RECTOCELE REPAIR N/A 12/25/2021   Procedure: POSTERIOR REPAIR (RECTOCELE);  Surgeon: Arma Lamp, MD;  Location: [facility];  Service: Gynecology;  Laterality: N/A;   WISDOM TOOTH EXTRACTION       SOCIAL HISTORY: Social History   Socioeconomic History   Marital status: Married    Spouse name: Not on file   Number of children: 2   Years of education: Not on file   Highest education level: Not on file  Occupational History   Occupation: Armed forces operational officer    Employer: [employer]  Tobacco Use   Smoking status: Never   Smokeless tobacco: Never  Vaping Use   Vaping status: Never Used  Substance and Sexual Activity   Alcohol use: Not Currently    Alcohol/week: 2.0 standard drinks of alcohol    Types: 2 Glasses of wine per week   Drug use: Never   Sexual activity: Yes    Partners: Male    Birth control/protection: Other-see comments    Comment: husband vasectomy  Other Topics Concern   Not on file  Social History Narrative   Not on file   Social Drivers of Health   Financial Resource Strain: Not on file  Food Insecurity: Not on file  Transportation Needs: Not on file  Physical Activity: Unknown (10/02/2022)   Exercise Vital Sign    Days of Exercise per Week: 3 days    Minutes of Exercise per Session: Not on file  Stress: Not on file  Social Connections: Moderately Isolated (10/02/2022)   Social Connection and Isolation Panel [NHANES]  Frequency of Communication with Friends and Family: More than three times a week    Frequency of Social Gatherings with Friends and Family: Twice a week    Attends Religious Services: Never    Database administrator or Organizations: No    Attends Engineer, structural: Not on file    Marital Status: Married  Catering manager Violence: Not on file    FAMILY HISTORY: Family History  Problem Relation Age of Onset   Hyperlipidemia Mother    Depression Father    Supraventricular tachycardia Father    Hyperlipidemia Father    Melanoma Father    Diabetes Maternal Grandmother    Skin cancer Paternal Grandfather    Breast cancer Neg Hx     ALLERGIES:  She is allergic to sumatriptan  and  atorvastatin.  MEDICATIONS:  Current Outpatient Medications  Medication Sig Dispense Refill   famotidine  (PEPCID  AC) 10 MG tablet Take 1 tablet (10 mg total) by mouth 2 (two) times daily. 180 tablet 0   Iron , Ferrous Sulfate , 325 (65 Fe) MG TABS Take 325 mg by mouth daily. 30 tablet 2   pravastatin  (PRAVACHOL ) 10 MG tablet Take 1 tablet (10 mg total) by mouth daily. 90 tablet 3   Collagen-Vitamin C-Biotin (COLLAGEN 1500/C PO) Take by mouth. (Patient not taking: Reported on 10/30/2023)     No current facility-administered medications for this visit.    REVIEW OF SYSTEMS:    Review of Systems - Oncology  All other pertinent systems were reviewed with the patient and are negative.  PHYSICAL EXAMINATION:   Onc Performance Status - 11/01/23 0900       ECOG Perf Status   ECOG Perf Status Fully active, able to carry on all pre-disease performance without restriction      KPS SCALE   KPS % SCORE Able to carry on normal activity, minor s/s of disease              Vitals:   10/30/23 1249  BP: (!) 102/58  Pulse: 64  Resp: 17  Temp: 98.4 F (36.9 C)  SpO2: 97%   Filed Weights   10/30/23 1249  Weight: 196 lb 6.4 oz (89.1 kg)    Physical Exam Constitutional:      General: She is not in acute distress.    Appearance: Normal appearance. She is well-developed.  HENT:     Head: Normocephalic and atraumatic.     Mouth/Throat:     Mouth: Mucous membranes are moist.  Eyes:     General: No scleral icterus.    Conjunctiva/sclera: Conjunctivae normal.  Cardiovascular:     Rate and Rhythm: Normal rate and regular rhythm.     Heart sounds: Normal heart sounds.  Pulmonary:     Effort: Pulmonary effort is normal. No respiratory distress.     Breath sounds: Normal breath sounds.  Abdominal:     General: There is no distension.     Palpations: Abdomen is soft. There is no mass.     Tenderness: There is no abdominal tenderness.  Musculoskeletal:     Right lower leg: No edema.      Left lower leg: No edema.  Lymphadenopathy:     Cervical: No cervical adenopathy.  Neurological:     General: No focal deficit present.     Mental Status: She is alert and oriented to person, place, and time.  Psychiatric:        Mood and Affect: Mood normal.  Behavior: Behavior normal.     LABORATORY DATA:   I have reviewed the data as listed  Results for orders placed or performed in visit on 10/30/23  TSH  Result Value Ref Range   TSH 3.020 0.350 - 4.500 uIU/mL  Sedimentation rate  Result Value Ref Range   Sed Rate 6 0 - 22 mm/hr  C-reactive protein  Result Value Ref Range   CRP <0.5 <1.0 mg/dL  Ferritin  Result Value Ref Range   Ferritin 221 11 - 307 ng/mL  Iron  and Iron  Binding Capacity ([facility] only)  Result Value Ref Range   Iron  100 28 - 170 ug/dL   TIBC 562 130 - 865 ug/dL   Saturation Ratios 32 (H) 10.4 - 31.8 %   UIBC 212 148 - 442 ug/dL  CMP (Cancer Center only)  Result Value Ref Range   Sodium 140 135 - 145 mmol/L   Potassium 4.1 3.5 - 5.1 mmol/L   Chloride 106 98 - 111 mmol/L   CO2 27 22 - 32 mmol/L   Glucose, Bld 90 70 - 99 mg/dL   BUN 12 6 - 20 mg/dL   Creatinine 7.84 6.96 - 1.00 mg/dL   Calcium 9.4 8.9 - 29.5 mg/dL   Total Protein 7.5 6.5 - 8.1 g/dL   Albumin 4.8 3.5 - 5.0 g/dL   AST 12 (L) 15 - 41 U/L   ALT 10 0 - 44 U/L   Alkaline Phosphatase 76 38 - 126 U/L   Total Bilirubin 0.5 0.0 - 1.2 mg/dL   GFR, Estimated >28 >41 mL/min   Anion gap 7 5 - 15  CBC with Differential (Cancer Center Only)  Result Value Ref Range   WBC Count 5.5 4.0 - 10.5 K/uL   RBC 4.62 3.87 - 5.11 MIL/uL   Hemoglobin 14.1 12.0 - 15.0 g/dL   HCT 32.4 40.1 - 02.7 %   MCV 89.0 80.0 - 100.0 fL   MCH 30.5 26.0 - 34.0 pg   MCHC 34.3 30.0 - 36.0 g/dL   RDW 25.3 66.4 - 40.3 %   Platelet Count 402 (H) 150 - 400 K/uL   nRBC 0.0 0.0 - 0.2 %   Neutrophils Relative % 56 %   Neutro Abs 3.1 1.7 - 7.7 K/uL   Lymphocytes Relative 30 %   Lymphs Abs 1.7 0.7 - 4.0  K/uL   Monocytes Relative 9 %   Monocytes Absolute 0.5 0.1 - 1.0 K/uL   Eosinophils Relative 4 %   Eosinophils Absolute 0.2 0.0 - 0.5 K/uL   Basophils Relative 1 %   Basophils Absolute 0.0 0.0 - 0.1 K/uL   Immature Granulocytes 0 %   Abs Immature Granulocytes 0.01 0.00 - 0.07 K/uL     RADIOGRAPHIC STUDIES:  No recent pertinent imaging available to review.   Orders Placed This Encounter  Procedures   CBC with Differential (Cancer Center Only)    Standing Status:   Future    Number of Occurrences:   1    Expiration Date:   10/29/2024   CMP (Cancer Center only)    Standing Status:   Future    Number of Occurrences:   1    Expiration Date:   10/29/2024   Iron  and Iron  Binding Capacity ([facility] only)    Standing Status:   Future    Number of Occurrences:   1    Expiration Date:   10/29/2024   Ferritin    Standing Status:   Future  Number of Occurrences:   1    Expiration Date:   10/29/2024   C-reactive protein    Standing Status:   Future    Number of Occurrences:   1    Expiration Date:   10/29/2024   Sedimentation rate    Standing Status:   Future    Number of Occurrences:   1    Expiration Date:   10/29/2024   TSH    Standing Status:   Future    Number of Occurrences:   1    Expiration Date:   10/29/2024   JAK2 [id number] rfx CALR/MPL/E12-15    Standing Status:   Future    Number of Occurrences:   1    Expiration Date:   10/29/2024    Future Appointments  Date Time Provider Department Center  11/13/2023  3:00 PM Huriel Matt, Gale Jude, MD [facility] None  12/30/2023  8:00 AM Felicita Horns, FNP [facility]  05/07/2024 11:15 AM [facility] LAB [facility] None  05/07/2024 11:45 AM Rohin Krejci, Gale Jude, MD [facility] None      This document was completed utilizing speech recognition software. Grammatical errors, random word insertions, pronoun errors, and incomplete sentences are an occasional consequence of this system due to software limitations, ambient noise, and hardware  issues. Any formal questions or concerns about the content, text or information contained within the body of this dictation should be directly addressed to the provider for clarification.

## 2023-11-01 ENCOUNTER — Encounter: Payer: Self-pay | Admitting: Oncology

## 2023-11-01 DIAGNOSIS — D75839 Thrombocytosis, unspecified: Secondary | ICD-10-CM | POA: Insufficient documentation

## 2023-11-01 NOTE — Assessment & Plan Note (Signed)
 Mild thrombocytosis with platelet counts ranging from 408,000 to 460,000, slightly above the normal range.  Possible causes include iron  deficiency, inflammation, or infection. No significant risk of clotting at current levels.   Bone marrow mutation is unlikely given the mild degree of thrombocytosis and normal white and red blood cell counts. Chronic inflammation from foot injury and recurrent UTIs may contribute to elevated platelet levels.  Labs today showed platelet count of 402,000, nearly normal.  White count 4000.8 with normal differential.  Hemoglobin normal at 14.1.  CMP, iron  studies, ESR, CRP, TSH are all within normal limits.  Given fluctuating platelet count, we did check for JAK2 mutation analysis with reflex testing, although clinical picture does not seem to suggest primary myeloproliferative neoplasm.  - Schedule phone visit in two weeks to discuss test results  - Schedule follow-up visit in six months.  She was provided reassurance.

## 2023-11-05 LAB — JAK2 V617F RFX CALR/MPL/E12-15

## 2023-11-05 LAB — CALR +MPL + E12-E15  (REFLEX)

## 2023-11-13 ENCOUNTER — Inpatient Hospital Stay: Attending: Oncology | Admitting: Oncology

## 2023-11-13 ENCOUNTER — Encounter: Payer: Self-pay | Admitting: Oncology

## 2023-11-13 DIAGNOSIS — D75839 Thrombocytosis, unspecified: Secondary | ICD-10-CM

## 2023-11-13 NOTE — Progress Notes (Signed)
 [facility]  HEMATOLOGY-ONCOLOGY ELECTRONIC VISIT PROGRESS NOTE  PATIENT NAME: Robin Benton   MR#: [id number] DOB: [birth year]  DATE OF SERVICE: 11/13/2023  Patient Care Team: Robin Horns, FNP as PCP - General (Family Medicine)  I connected with the patient via telephone conference and verified that I am speaking with the correct person using two identifiers. The patient's location is at home and I am providing care from the [facility].  I discussed the limitations, risks, security and privacy concerns of performing an evaluation and management service by e-visits and the availability of in person appointments. I also discussed with the patient that there may be a patient responsible charge related to this service. The patient expressed understanding and agreed to proceed.   ASSESSMENT & PLAN:   Robin Benton is a 53 y.o.  lady with a past medical history of basal cell skin cancer, dyslipidemia, GERD, migraines, was referred to our service in May 2025 for evaluation of thrombocytosis.  Mild thrombocytosis.  Negative MPN workup.  Likely reactionary to recurrent UTIs.  Thrombocytosis Mild thrombocytosis with platelet counts ranging from 408,000 to 460,000, slightly above the normal range.  On her consultation with us  on 10/30/2023, labs showed platelet count of 402,000, nearly normal.  White count 5500 with normal differential.  Hemoglobin normal at 14.1.  CMP, iron  studies, ESR, CRP, TSH were all within normal limits.  JAK2 V617F mutation analysis was negative, followed by reflex testing to include CALR, MPL, exon 12-15 mutations were all negative.  No evidence of primary myeloproliferative neoplasm.   Chronic inflammation from foot injury and recurrent UTIs may contribute to elevated platelet levels.   Continue current iron  supplementation regimen to maintain blood counts.  She was provided reassurance.  Will see her as scheduled in December 2025.  If stable labs, she can  be discharged from our office after next visit.   I discussed the assessment and treatment plan with the patient. The patient was provided an opportunity to ask questions and all were answered. The patient agreed with the plan and demonstrated an understanding of the instructions. The patient was advised to call back or seek an in-person evaluation if the symptoms worsen or if the condition fails to improve as anticipated.    I spent 11 minutes over the phone with the patient reviewing test results, discuss management and coordination/planning of care.  Robin Berber, MD 11/13/2023 3:10 PM [facility] - A DEPT OF [facility] [address] [place] [address] Dept: [phone] Dept Fax: [phone]   INTERVAL HISTORY:  Please see above for problem oriented charting.  The purpose of today's discussion is to explain recent lab results and to formulate plan of care.  Discussed the use of AI scribe software for clinical note transcription with the patient, who gave verbal consent to proceed.  History of Present Illness Robin Benton is a 53 year old female who presents for follow-up of her blood work results.  Recent blood work shows a platelet count of 402,000, slightly above the normal range. White and red blood cell counts are within normal limits, and other blood parameters remain stable. Chemistries and iron  levels are normal, with no signs of increased inflammation as indicated by normal CRP and ESR levels. Thyroid  function tests are also normal.  She has been taking one over-the-counter iron  pill daily for about a year, having previously taken two pills daily. She inquires about continuing  this regimen.    SUMMARY OF HEMATOLOGY HISTORY:  On 10/01/2023, labs at her PCPs office showed platelet count of 410,000.  Previously labs in January 2025 showed platelet count of 412,000.  Given slightly increased platelet count,  referral was sent to us  for further evaluation.   She has a history of elevated platelet count since August 2021, with levels ranging from 408,000 to 460,000, the highest recorded in January 2024. She has a history of iron  deficiency and is currently taking over-the-counter iron  supplements once daily, having reduced from twice daily due to stomach discomfort. Her iron  level was previously at 20 and has increased to about 50 with supplementation.   She experienced a foot injury two years ago, resulting in unresolved blood and daily swelling and pain in her left foot. An MRI showed no fractures but indicated retained blood. The foot continues to swell daily, though less than initially.   She is perimenopausal, having had only three menstrual cycles since October 2024, with a history of heavy menstrual cycles.   She experiences chronic urinary tract infections, with several occurrences each year, the most recent being a few months ago. She also has a history of bronchitis, which she experienced frequently during childhood and had a few years ago.   No history of spleen surgery, trauma, or thyroid  issues. She does not smoke and works part-time as a Armed forces operational officer at [employer] and fills in at [employer] as needed.    On her consultation with us  on 10/30/2023, labs showed platelet count of 402,000, nearly normal.  White count 5500 with normal differential.  Hemoglobin normal at 14.1.  CMP, iron  studies, ESR, CRP, TSH were all within normal limits.  JAK2 V617F mutation analysis was negative, followed by reflex testing to include CALR, MPL, exon 12-15 mutations were all negative.  No evidence of primary myeloproliferative neoplasm.   Chronic inflammation from foot injury and recurrent UTIs may contribute to elevated platelet levels.  She was provided reassurance.  Will see her as scheduled in December 2025.  If stable labs, she can be discharged from our office after next visit.     REVIEW OF SYSTEMS:    Review of Systems - Oncology  All other pertinent systems were reviewed with the patient and are negative.  I have reviewed the past medical history, past surgical history, social history and family history with the patient and they are unchanged from previous note.  ALLERGIES:  She is allergic to sumatriptan  and atorvastatin.  MEDICATIONS:  Current Outpatient Medications  Medication Sig Dispense Refill   Collagen-Vitamin C-Biotin (COLLAGEN 1500/C PO) Take by mouth. (Patient not taking: Reported on 10/30/2023)     famotidine  (PEPCID  AC) 10 MG tablet Take 1 tablet (10 mg total) by mouth 2 (two) times daily. 180 tablet 0   Iron , Ferrous Sulfate , 325 (65 Fe) MG TABS Take 325 mg by mouth daily. 30 tablet 2   pravastatin  (PRAVACHOL ) 10 MG tablet Take 1 tablet (10 mg total) by mouth daily. 90 tablet 3   No current facility-administered medications for this visit.    PHYSICAL EXAMINATION:    Onc Performance Status - 11/13/23 1500       ECOG Perf Status   ECOG Perf Status Fully active, able to carry on all pre-disease performance without restriction      KPS SCALE   KPS % SCORE Able to carry on normal activity, minor s/s of disease  LABORATORY DATA:   I have reviewed the data as listed.  Recent Results (from the past 2160 hours)  CBC     Status: Abnormal   Collection Time: 10/01/23  7:50 AM  Result Value Ref Range   WBC 7.5 4.0 - 10.5 K/uL   RBC 4.57 3.87 - 5.11 Mil/uL   Platelets 410.0 (H) 150.0 - 400.0 K/uL   Hemoglobin 14.1 12.0 - 15.0 g/dL   HCT 78.2 95.6 - 21.3 %   MCV 92.4 78.0 - 100.0 fl   MCHC 33.4 30.0 - 36.0 g/dL   RDW 08.6 57.8 - 46.9 %  VITAMIN D  25 Hydroxy (Vit-D Deficiency, Fractures)     Status: None   Collection Time: 10/01/23  7:50 AM  Result Value Ref Range   VITD 52.67 30.00 - 100.00 ng/mL  Lipid panel     Status: Abnormal   Collection Time: 10/01/23  7:50 AM  Result Value Ref Range   Cholesterol 179 0 - 200  mg/dL    Comment: ATP III Classification       Desirable:  < 200 mg/dL               Borderline High:  200 - 239 mg/dL          High:  > = 629 mg/dL   Triglycerides 528.4 (H) 0.0 - 149.0 mg/dL    Comment: Normal:  <132 mg/dLBorderline High:  150 - 199 mg/dL   HDL 44.01 (L) >02.72 mg/dL   VLDL 53.6 (H) 0.0 - 64.4 mg/dL   LDL Cholesterol 034 (H) 0 - 99 mg/dL   Total CHOL/HDL Ratio 6     Comment:                Men          Women1/2 Average Risk     3.4          3.3Average Risk          5.0          4.42X Average Risk          9.6          7.13X Average Risk          15.0          11.0                       NonHDL 147.16     Comment: NOTE:  Non-HDL goal should be 30 mg/dL higher than patient's LDL goal (i.e. LDL goal of < 70 mg/dL, would have non-HDL goal of < 100 mg/dL)  JAK2 V425Z rfx DGLO/VFI/E33-29     Status: None   Collection Time: 10/30/23  1:42 PM  Result Value Ref Range   Specimen Type Comment:     Comment: NOT PROVIDED   JAK2 V617F Result Comment     Comment: (NOTE) NEGATIVE The JAK2 V617F mutation is not detected in the provided specimen of this individual. Results should be interpreted in conjunction with clinical and other laboratory findings for the most accurate interpretation. This test was developed and its performance characteristics determined by [facility]. It has not been cleared or approved by the Food and Drug Administration.    Reflex Comment     Comment: (NOTE) Reflex to CALR Mutation Analysis, JAK2 Exon 12-15 Mutation Analysis, and MPL Mutation Analysis is indicated.    V617F Rfx CALR/MPL/E12-15 Bkgd Comment     Comment: (NOTE)    Molecular testing  of blood or bone marrow is useful in the evaluation of suspected myeloproliferative neoplasms (MPN). Mutations in the JAK2, MPL, and CALR genes are present in virtually all MPNs and their presence help distinguish benign reactive processes from clonal neoplasms. These mutations are generally considered mutually  exclusive, although concurrent clones have been reported in rare patients. This test will assess for the JAK2V617F (exon 14) mutation first and will reflex to CALR mutation analysis, MPL mutation analysis, and JAK2 exon 12 to 15 mutation analysis if the JAK2V617F mutation is negative.    The JAK2 (Janus kinase 2) gene encodes for a non-receptor protein tyrosine kinase that activates cytokine and growth factor signaling. The V617F (c.1849 G>T) mutation results in constitutive activation of JAK2 and downstream STAT5 and ERK signaling. The V617F mutation is observed in approximately 95% of polycythemia vera (PV), 60% of essential thromboc ythemia (ET) and primary myelofibrosis (PMF). It is also infrequently present (3-5%) in myelodysplastic syndrome, chronic myelomonocytic leukemia, and other atypical chronic myeloid disorders. A small percentage of JAK2 mutation positive patients (3.3%) contain other non-V617F mutations within exons 12 to 15. In particular, mutations in exon 12 of JAK2 have been described in approximately 3% of patients with PV. JAK2 allele burden correlates with clinical phenotype, with low levels of mutant allele characterized by thrombocytosis, intermediate levels with erythrocytosis, and high mutant allele burden correlating with enhanced myelopoiesis of the BM, leukocytosis, increasing spleen size, and circulating CD34-positive cells.    The CALR (Calreticulin) gene encodes for a multifunctional calcium-binding protein involved in many cellular activities such as growth, proliferation, adhesion, and programmed cell death. Among patients with JAK2 negative MPNs, CALR are found in a pproximately 70% of patients with JAK2-negative essential thrombocythemia (ET) and 60-88% of patients with JAK2-negative primary myelofibrosis(PMF). Only a minority of patients (approximately 8%) with myelodysplasia have mutations in the CALR gene. CALR mutations are rarely detected in  patients with de novo acute myeloid leukemia, chronic myelogenous leukemia, lymphoid leukemia, or solid tumors. CALR mutations are not detected in polycythemia and generally appear to be mutually exclusive with JAK2 mutations and MPL mutations. The majority of mutational changes involve a variety of insertion deletion mutations in exon 9 of the calreticulin gene: approximately 53% of all CALR mutations are a 52 bp deletion (type-1) while the second most prevalent mutation (approximately 32%) contains a 5 bp insertion (type-2). Other mutations (non-type 1 or type 2) are seen in a small minority of cases. CALR mutations in PMF tend to be with a favorable prognosis compared to JAK2 V617F [employer], whereas primary myelofibrosis negative for CALR, JAK2 V617F and MPL mutations (so-called triple negative) is associated with a poor prognosis and shorter survival.    The MPL (myeloproliferative leukemia virus oncogene) gene encodes the thrombopoietin receptor which regulates hematopoiesis and megakaryopoiesis. Activating MPL mutations are associated with a subset of myeloproliferative neoplasms and acute megakaryoblastic leukemia. MPL W515 mutations are present in approximately 5-8% of patients with primary myelofibrosis (PMF) and 1-4% of patients with essential thrombocythemia (ET). The S505 mutation is detected in patients with hereditary thrombocythemia.    Limitations    This assay has a sensitivity of approximately 1% VAF for JAK2 V617F, 2.5% VAF for other mutations in JAK2 exons 12 to 15, CALR mutations, and MPL mutations. Deletions in JAK2 up to 6 bp and insertions up to 34 bp have been detected in validation studies. Deletions in CALR up to 70 bp and i nsertions up to 12 bp have been detected in validation studies.  Method based next generation sequencing.     Comment: Comment [employer]    References Comment     Comment: (NOTE) Alghasham N, Alnouri Y, Abalkhail H, Wilfredo Hanly.  Detection of mutations in JAK2 exons 12-15 by [employer] sequencing. Int J Lab Hematol. 2016 Feb;38(1):34-41. doi: 10.1111/ijlh.16109. Epub 2015 Sep 11. PMID: 60454098. Rana Burr, Charlie Constant, Hasserjian R, Serafin Dames, Borowitz MJ, Gina Lagos MM, Clarendon CD, Cazzola M, Vardiman JW. The 2016 revision to the World Health Organization classification of myeloid neoplasms and acute leukemia. Blood. 2016 May 19;127(20):2391-405. doi: 10.1182/blood-2016-03-643544. Epub 2016 Apr 11. PMID: 11914782. Asa Bjork [facility], Zhang ZJ, [place] S, Albitar M. Mutation profile of JAK2 transcripts in patients with chronic myeloproliferative neoplasias. J Mol Diagn. 2009 Jan;11(1):49-53.doi: 10.2353/jmoldx.2009.080114. Epub 2008 Dec 12. PMID: 95621308; PMCID: MVH8469629. NCCN Clinical Practice Guidelines in Oncology (NCCN Guidelines) Myeloproliferative Neoplasms Version 3.2022 - January 12, 2021. Swerdlow SH, Programmer, multimedia. WHO classif ication of Tumours of Haematopoietic and Lymphoid Tissues. 4th edn. Johanna Mutter, [place]: Geologist, engineering for [employer] on [employer]; 2017. Tefferi A. Primary myelofibrosis: 2021 update on diagnosis, risk-stratification and management. Am J Hematol. 2021 Jan;96(1):145-162. doi: 10.1002/ajh.26050. Epub 2020 Dec 2. PMID: 52841324. Vainchenker W, Kralovics R. Genetic basis and molecular pathophysiology of classical myeloproliferative neoplasms. Blood. 2017 Feb 9;129(6):667-679. doi: 10.1182/blood-2016-10-695940. Epub 2016 Dec 27. PMID: 40102725.    Director Review Comment     Comment: (NOTE) Brendolyn Callas, PhD, [facility]    Director, Molecular Oncology    [facility] for Molecular Biology and Pathology    [address] [place], [place] [address]    [phone] Performed At: [facility] [address] [place], [place] [id number] Adams Adams MDPhD [id number] Performed At: [facility] [address] [place], [place] [id number] Adams Adams MDPhD [id number]   Sedimentation  rate     Status: None   Collection Time: 10/30/23  1:42 PM  Result Value Ref Range   Sed Rate 6 0 - 22 mm/hr    Comment: Performed at [facility], [address], [place], [place] [address]  Iron  and Iron  Binding Capacity ([facility] only)     Status: Abnormal   Collection Time: 10/30/23  1:42 PM  Result Value Ref Range   Iron  100 28 - 170 ug/dL   TIBC 628 315 - 176 ug/dL   Saturation Ratios 32 (H) 10.4 - 31.8 %   UIBC 212 148 - 442 ug/dL    Comment: Performed at [facility], [address], [place], [place] [address]  CMP (Cancer Center only)     Status: Abnormal   Collection Time: 10/30/23  1:42 PM  Result Value Ref Range   Sodium 140 135 - 145 mmol/L   Potassium 4.1 3.5 - 5.1 mmol/L   Chloride 106 98 - 111 mmol/L   CO2 27 22 - 32 mmol/L   Glucose, Bld 90 70 - 99 mg/dL    Comment: Glucose reference range applies only to samples taken after fasting for at least 8 hours.   BUN 12 6 - 20 mg/dL   Creatinine 7.10 6.26 - 1.00 mg/dL   Calcium 9.4 8.9 - 94.8 mg/dL   Total Protein 7.5 6.5 - 8.1 g/dL   Albumin 4.8 3.5 - 5.0 g/dL   AST 12 (L) 15 - 41 U/L   ALT 10 0 - 44 U/L   Alkaline Phosphatase 76 38 - 126 U/L   Total Bilirubin 0.5 0.0 - 1.2 mg/dL   GFR,  Estimated >60 >60 mL/min    Comment: (NOTE) Calculated using the CKD-EPI Creatinine Equation (2021)    Anion gap 7 5 - 15    Comment: Performed at [facility], [address] [place], [place] [address]  CBC with Differential (Cancer Center Only)     Status: Abnormal   Collection Time: 10/30/23  1:42 PM  Result Value Ref Range   WBC Count 5.5 4.0 - 10.5 K/uL   RBC 4.62 3.87 - 5.11 MIL/uL   Hemoglobin 14.1 12.0 - 15.0 g/dL   HCT 91.4 78.2 - 95.6 %   MCV 89.0 80.0 - 100.0 fL   MCH 30.5 26.0 - 34.0 pg   MCHC 34.3 30.0 - 36.0 g/dL   RDW 21.3 08.6 - 57.8 %   Platelet Count 402 (H) 150 - 400 K/uL   nRBC 0.0 0.0 - 0.2 %   Neutrophils Relative % 56 %   Neutro  Abs 3.1 1.7 - 7.7 K/uL   Lymphocytes Relative 30 %   Lymphs Abs 1.7 0.7 - 4.0 K/uL   Monocytes Relative 9 %   Monocytes Absolute 0.5 0.1 - 1.0 K/uL   Eosinophils Relative 4 %   Eosinophils Absolute 0.2 0.0 - 0.5 K/uL   Basophils Relative 1 %   Basophils Absolute 0.0 0.0 - 0.1 K/uL   Immature Granulocytes 0 %   Abs Immature Granulocytes 0.01 0.00 - 0.07 K/uL    Comment: Performed at [facility], [address] [place], [place] [address]  CALR +MPL + E12-E15 (reflexed)     Status: None   Collection Time: 10/30/23  1:42 PM  Result Value Ref Range   CALR Result Comment     Comment: (NOTE) NEGATIVE No insertions or deletions were detected within the analyzed region of the calreticulin (CALR) gene. A negative result does not entirely exclude the possibility of a clonal population carrying CALR gene mutations that are not covered by this assay. Results should be interpreted in conjunction with clinical and laboratory findings for the most accurate interpretation.    MPL Result Comment     Comment: (NOTE) NEGATIVE No MPL mutation was identified in the provided specimen of this individual. Results should be interpreted in conjunction with clinical and other laboratory findings for the most accurate interpretation.    E12-15 Result Comment     Comment: (NOTE) NEGATIVE    JAK2 mutations were not detected in exons 12, 13, 14 and 15. The G to T nucleotide change encoding the V617F mutation was not detected. This result does not rule out the presence of JAK2 mutation at a level below the detection sensitivity of this assay, the presence of other mutations outside the analyzed region of the JAK2 gene, or the presence of a myeloproliferative or other neoplasm. Result must be correlated with other clinical data for the most accurate diagnosis. Performed At: [facility] [address] [place], [place] [id number] Adams Adams MDPhD [id number]   TSH      Status: None   Collection Time: 10/30/23  1:43 PM  Result Value Ref Range   TSH 3.020 0.350 - 4.500 uIU/mL    Comment: Performed at [facility], [address], [place], [place] [address]  C-reactive protein     Status: None   Collection Time: 10/30/23  1:43 PM  Result Value Ref Range   CRP <0.5 <1.0 mg/dL    Comment: Performed at [facility], [address] [place], [place] [address]  Ferritin     Status: None   Collection Time: 10/30/23  1:43 PM  Result Value Ref Range   Ferritin 221 11 - 307 ng/mL    Comment: Performed at [facility], [address] [place], [place] [address]     RADIOGRAPHIC STUDIES:  No recent pertinent imaging studies available to review.  Orders Placed This Encounter  Procedures   CBC with Differential (Cancer Center Only)    Standing Status:   Future    Expected Date:   05/14/2024    Expiration Date:   11/12/2024     Future Appointments  Date Time Provider Department Center  12/30/2023  8:00 AM Robin Horns, FNP [facility]  05/07/2024 11:15 AM [facility] None  05/07/2024 11:45 AM Ikeisha Blumberg, Gale Jude, MD [facility] None    This document was completed utilizing speech recognition software. Grammatical errors, random word insertions, pronoun errors, and incomplete sentences are an occasional consequence of this system due to software limitations, ambient noise, and hardware issues. Any formal questions or concerns about the content, text or information contained within the body of this dictation should be directly addressed to the provider for clarification.

## 2023-11-13 NOTE — Assessment & Plan Note (Addendum)
 Mild thrombocytosis with platelet counts ranging from 408,000 to 460,000, slightly above the normal range.  On her consultation with us  on 10/30/2023, labs showed platelet count of 402,000, nearly normal.  White count 5500 with normal differential.  Hemoglobin normal at 14.1.  CMP, iron  studies, ESR, CRP, TSH were all within normal limits.  JAK2 V617F mutation analysis was negative, followed by reflex testing to include CALR, MPL, exon 12-15 mutations were all negative.  No evidence of primary myeloproliferative neoplasm.   Chronic inflammation from foot injury and recurrent UTIs may contribute to elevated platelet levels.   Continue current iron  supplementation regimen to maintain blood counts.  She was provided reassurance.  Will see her as scheduled in December 2025.  If stable labs, she can be discharged from our office after next visit.

## 2023-12-30 ENCOUNTER — Ambulatory Visit: Payer: Commercial Managed Care - PPO | Admitting: Family

## 2024-01-03 ENCOUNTER — Ambulatory Visit: Admitting: Family

## 2024-01-03 ENCOUNTER — Encounter: Payer: Self-pay | Admitting: Family

## 2024-01-03 VITALS — BP 100/70 | HR 73 | Temp 98.2°F | Ht 65.0 in | Wt 191.0 lb

## 2024-01-03 DIAGNOSIS — Z9889 Other specified postprocedural states: Secondary | ICD-10-CM | POA: Diagnosis not present

## 2024-01-03 DIAGNOSIS — N951 Menopausal and female climacteric states: Secondary | ICD-10-CM

## 2024-01-03 DIAGNOSIS — Z23 Encounter for immunization: Secondary | ICD-10-CM

## 2024-01-03 DIAGNOSIS — E7800 Pure hypercholesterolemia, unspecified: Secondary | ICD-10-CM | POA: Diagnosis not present

## 2024-01-03 DIAGNOSIS — Z1231 Encounter for screening mammogram for malignant neoplasm of breast: Secondary | ICD-10-CM | POA: Diagnosis not present

## 2024-01-03 LAB — LIPID PANEL
Cholesterol: 212 mg/dL — ABNORMAL HIGH (ref 0–200)
HDL: 34.7 mg/dL — ABNORMAL LOW (ref >39.00)
LDL Cholesterol: 129 mg/dL — ABNORMAL HIGH (ref 0–99)
NonHDL: 177.19
Total CHOL/HDL Ratio: 6
Triglycerides: 239 mg/dL — ABNORMAL HIGH (ref 0.0–149.0)
VLDL: 47.8 mg/dL — ABNORMAL HIGH (ref 0.0–40.0)

## 2024-01-03 NOTE — Patient Instructions (Signed)

## 2024-01-03 NOTE — Assessment & Plan Note (Signed)
 Ordered lipid panel, pending results. Work on low cholesterol diet and exercise as tolerated Continue pravastatin  10 mg nightly.

## 2024-01-03 NOTE — Progress Notes (Signed)
 Established Patient Office Visit  Subjective:      CC:  Chief Complaint  Patient presents with   Hyperlipidemia    Following up on pravastatin .    HPI: Robin Benton is a 53 y.o. female presenting on 01/03/2024 for Hyperlipidemia (Following up on pravastatin .)   HLD: currently tolerating pravastatin  10 mg once nightly, working on a low cholesterol diet       Social history:  Relevant past medical, surgical, family and social history reviewed and updated as indicated. Interim medical history since our last visit reviewed.  Allergies and medications reviewed and updated.  DATA REVIEWED: CHART IN EPIC     ROS: Negative unless specifically indicated above in HPI.    Current Outpatient Medications:    cetirizine (ZYRTEC) 10 MG tablet, Take 10 mg by mouth daily., Disp: , Rfl:    famotidine  (PEPCID  AC) 10 MG tablet, Take 1 tablet (10 mg total) by mouth 2 (two) times daily., Disp: 180 tablet, Rfl: 0   Iron , Ferrous Sulfate , 325 (65 Fe) MG TABS, Take 325 mg by mouth daily., Disp: 30 tablet, Rfl: 2   OVER THE COUNTER MEDICATION, Nutrafol Supplement, Disp: , Rfl:    pravastatin  (PRAVACHOL ) 10 MG tablet, Take 1 tablet (10 mg total) by mouth daily., Disp: 90 tablet, Rfl: 3      Objective:    BP 100/70 (BP Location: Left Arm, Patient Position: Sitting, Cuff Size: Large)   Pulse 73   Temp 98.2 F (36.8 C) (Oral)   Ht 5' 5 (1.651 m)   Wt 191 lb (86.6 kg)   SpO2 93%   BMI 31.78 kg/m   Wt Readings from Last 3 Encounters:  01/03/24 191 lb (86.6 kg)  10/30/23 196 lb 6.4 oz (89.1 kg)  07/02/23 198 lb 3.2 oz (89.9 kg)    Physical Exam Constitutional:      General: She is not in acute distress.    Appearance: Normal appearance. She is normal weight. She is not ill-appearing, toxic-appearing or diaphoretic.  HENT:     Head: Normocephalic.  Cardiovascular:     Rate and Rhythm: Normal rate and regular rhythm.  Pulmonary:     Effort: Pulmonary effort is normal.   Musculoskeletal:        General: Normal range of motion.  Neurological:     General: No focal deficit present.     Mental Status: She is alert and oriented to person, place, and time. Mental status is at baseline.  Psychiatric:        Mood and Affect: Mood normal.        Behavior: Behavior normal.        Thought Content: Thought content normal.        Judgment: Judgment normal.           Assessment & Plan:  Screening mammogram for breast cancer -     3D Screening Mammogram, Left and Right; Future  Elevated LDL cholesterol level Assessment & Plan: Ordered lipid panel, pending results. Work on low cholesterol diet and exercise as tolerated Continue pravastatin  10 mg nightly   Orders: -     Lipid panel  History of breast lump/mass excision  Menopausal and female climacteric states Assessment & Plan: Consider black cohash otc  If no relief can consider alternative options    Need for vaccination against Streptococcus pneumoniae -     Pneumococcal conjugate vaccine 20-valent     Return in about 1 year (around 01/02/2025) for f/u CPE.  Evangelina Delancey  Akshath Mccarey, MSN, APRN, FNP-C [facility]

## 2024-01-03 NOTE — Assessment & Plan Note (Signed)
 Consider black cohash otc  If no relief can consider alternative options

## 2024-01-06 ENCOUNTER — Ambulatory Visit: Payer: Self-pay | Admitting: Family

## 2024-03-30 ENCOUNTER — Encounter: Payer: Self-pay | Admitting: Radiology

## 2024-03-30 ENCOUNTER — Ambulatory Visit
Admission: RE | Admit: 2024-03-30 | Discharge: 2024-03-30 | Disposition: A | Discharge status: Home / Self Care | Source: Ambulatory Visit | Attending: Family | Admitting: Family

## 2024-03-30 DIAGNOSIS — Z1231 Encounter for screening mammogram for malignant neoplasm of breast: Secondary | ICD-10-CM | POA: Insufficient documentation

## 2024-03-31 ENCOUNTER — Other Ambulatory Visit: Payer: Self-pay | Admitting: Family

## 2024-03-31 DIAGNOSIS — E7800 Pure hypercholesterolemia, unspecified: Secondary | ICD-10-CM

## 2024-04-10 ENCOUNTER — Other Ambulatory Visit

## 2024-04-10 DIAGNOSIS — E7800 Pure hypercholesterolemia, unspecified: Secondary | ICD-10-CM | POA: Diagnosis not present

## 2024-04-10 LAB — LIPID PANEL
Cholesterol: 187 mg/dL (ref 0–200)
HDL: 34.7 mg/dL — ABNORMAL LOW (ref >39.00)
LDL Cholesterol: 124 mg/dL — ABNORMAL HIGH (ref 0–99)
NonHDL: 152.1
Total CHOL/HDL Ratio: 5
Triglycerides: 142 mg/dL (ref 0.0–149.0)
VLDL: 28.4 mg/dL (ref 0.0–40.0)

## 2024-04-13 ENCOUNTER — Ambulatory Visit: Payer: Self-pay | Admitting: Family

## 2024-05-07 ENCOUNTER — Inpatient Hospital Stay: Attending: Oncology

## 2024-05-07 ENCOUNTER — Inpatient Hospital Stay: Admitting: Oncology

## 2024-05-07 VITALS — BP 106/68 | HR 66 | Temp 97.9°F | Resp 17 | Ht 65.0 in | Wt 190.0 lb

## 2024-05-07 DIAGNOSIS — D75839 Thrombocytosis, unspecified: Secondary | ICD-10-CM

## 2024-05-07 DIAGNOSIS — Z79899 Other long term (current) drug therapy: Secondary | ICD-10-CM | POA: Insufficient documentation

## 2024-05-07 LAB — CBC WITH DIFFERENTIAL (CANCER CENTER ONLY)
Abs Immature Granulocytes: 0.01 K/uL (ref 0.00–0.07)
Basophils Absolute: 0.1 K/uL (ref 0.0–0.1)
Basophils Relative: 1 %
Eosinophils Absolute: 0.2 K/uL (ref 0.0–0.5)
Eosinophils Relative: 4 %
HCT: 41.3 % (ref 36.0–46.0)
Hemoglobin: 14 g/dL (ref 12.0–15.0)
Immature Granulocytes: 0 %
Lymphocytes Relative: 35 %
Lymphs Abs: 2.2 K/uL (ref 0.7–4.0)
MCH: 30.3 pg (ref 26.0–34.0)
MCHC: 33.9 g/dL (ref 30.0–36.0)
MCV: 89.4 fL (ref 80.0–100.0)
Monocytes Absolute: 0.6 K/uL (ref 0.1–1.0)
Monocytes Relative: 10 %
Neutro Abs: 3.2 K/uL (ref 1.7–7.7)
Neutrophils Relative %: 50 %
Platelet Count: 383 K/uL (ref 150–400)
RBC: 4.62 MIL/uL (ref 3.87–5.11)
RDW: 12.9 % (ref 11.5–15.5)
WBC Count: 6.4 K/uL (ref 4.0–10.5)
nRBC: 0 % (ref 0.0–0.2)

## 2024-05-07 NOTE — Assessment & Plan Note (Addendum)
 Mild thrombocytosis with platelet counts ranging from 408,000 to 460,000, slightly above the normal range.  On her consultation with us  on 10/30/2023, labs showed platelet count of 402,000, nearly normal.  White count 5500 with normal differential.  Hemoglobin normal at 14.1.  CMP, iron  studies, ESR, CRP, TSH were all within normal limits.  JAK2 V617F mutation analysis was negative, followed by reflex testing to include CALR, MPL, exon 12-15 mutations were all negative.  No evidence of primary myeloproliferative neoplasm.   Chronic inflammation from foot injury and recurrent UTIs may contribute to elevated platelet levels.   Continue current iron  supplementation regimen to maintain blood counts.  Lately her labs have been within normal limits.  Platelet count today is normal at 383,000.  White count and hemoglobin are also within normal limits.  She was provided reassurance.    - Continue monitoring platelet count with primary care provider. - If platelet count consistently exceeds 500,000, will consider starting daily baby aspirin to prevent thrombosis. - No further hematology follow-up needed unless platelet count exceeds 500,000.  Since no additional hematological workup or intervention is needed, patient can be discharged from our office for continued follow-up with her PCP and other subspecialists.  Please reach out to us  with any questions or concerns.  Please reconsult us  as necessary.  Thank you for giving us  an opportunity to participate in her care.

## 2024-05-07 NOTE — Progress Notes (Signed)
 [facility]  HEMATOLOGY CLINIC PROGRESS NOTE  PATIENT NAME: Robin Benton   MR#: [id number] DOB: November 05, 1970  Patient Care Team: Corwin Antu, FNP as PCP - General (Family Medicine)  Date of visit: 05/07/2024   ASSESSMENT & PLAN:   Robin Benton is a 53 y.o. lady with a past medical history of basal cell skin cancer, dyslipidemia, GERD, migraines, was referred to our service in May 2025 for evaluation of thrombocytosis.  Mild thrombocytosis.  Negative MPN workup.  Likely reactionary to recurrent UTIs.    Thrombocytosis Mild thrombocytosis with platelet counts ranging from 408,000 to 460,000, slightly above the normal range.  On her consultation with us  on 10/30/2023, labs showed platelet count of 402,000, nearly normal.  White count 5500 with normal differential.  Hemoglobin normal at 14.1.  CMP, iron  studies, ESR, CRP, TSH were all within normal limits.  JAK2 V617F mutation analysis was negative, followed by reflex testing to include CALR, MPL, exon 12-15 mutations were all negative.  No evidence of primary myeloproliferative neoplasm.   Chronic inflammation from foot injury and recurrent UTIs may contribute to elevated platelet levels.   Continue current iron  supplementation regimen to maintain blood counts.  Lately her labs have been within normal limits.  Platelet count today is normal at 383,000.  White count and hemoglobin are also within normal limits.  She was provided reassurance.    - Continue monitoring platelet count with primary care provider. - If platelet count consistently exceeds 500,000, will consider starting daily baby aspirin to prevent thrombosis. - No further hematology follow-up needed unless platelet count exceeds 500,000.  Since no additional hematological workup or intervention is needed, patient can be discharged from our office for continued follow-up with her PCP and other subspecialists.  Please reach out to us  with any questions or concerns.   Please reconsult us  as necessary.  Thank you for giving us  an opportunity to participate in her care.    I spent a total of 20 minutes during this encounter with the patient including review of chart and various tests results, discussions about plan of care and coordination of care plan.  I reviewed lab results and outside records for this visit and discussed relevant results with the patient. Diagnosis, plan of care and treatment options were also discussed in detail with the patient. Opportunity provided to ask questions and answers provided to her apparent satisfaction. Provided instructions to call our clinic with any problems, questions or concerns prior to return visit. I recommended to continue follow-up with PCP and sub-specialists. She verbalized understanding and agreed with the plan. No barriers to learning was detected.  Chinita Patten, MD  05/07/2024 11:47 AM  [facility] - A DEPT [facility] [address] [place] [address] Dept: [phone] Dept Fax: [phone]   CHIEF COMPLAINT/ REASON FOR VISIT:  History of chronic, intermittent thrombocytosis, reactionary.  Workup negative for MPN  INTERVAL HISTORY:  Discussed the use of AI scribe software for clinical note transcription with the patient, who gave verbal consent to proceed.  History of Present Illness Robin Benton is a 53 year old female who presents for follow-up of elevated platelet count.  She has a history of elevated platelet count, initially noted to be slightly high at 410,000/L. Over the years, her platelet count has fluctuated, with recent tests showing normalization to 383,000/L. Previous evaluations, including bone marrow studies, were performed. Her platelet count was 402,000/L  at the last visit.  She has a history of urinary tract infections but reports that it has been a while since her last episode, and she is currently not  experiencing any issues.  She takes an over-the-counter iron  supplement daily, having reduced her intake from two pills to one pill per day. She also takes vitamin D  daily. She is on cholesterol medication and undergoes blood tests every six months to monitor her cholesterol levels.  She had a colonoscopy within the last couple of years, which revealed a polyp in April of the previous year.    SUMMARY OF HEMATOLOGIC HISTORY:  On 10/01/2023, labs at her PCPs office showed platelet count of 410,000.  Previously labs in January 2025 showed platelet count of 412,000.  Given slightly increased platelet count, referral was sent to us  for further evaluation.   She has a history of elevated platelet count since August 2021, with levels ranging from 408,000 to 460,000, the highest recorded in January 2024. She has a history of iron  deficiency and is currently taking over-the-counter iron  supplements once daily, having reduced from twice daily due to stomach discomfort. Her iron  level was previously at 20 and has increased to about 50 with supplementation.   She experienced a foot injury two years ago, resulting in unresolved blood and daily swelling and pain in her left foot. An MRI showed no fractures but indicated retained blood. The foot continues to swell daily, though less than initially.   She is perimenopausal, having had only three menstrual cycles since October 2024, with a history of heavy menstrual cycles.   She experiences chronic urinary tract infections, with several occurrences each year, the most recent being a few months ago. She also has a history of bronchitis, which she experienced frequently during childhood and had a few years ago.   No history of spleen surgery, trauma, or thyroid  issues. She does not smoke and works part-time as a armed forces operational officer at [employer] and fills in at [employer] as needed.    On her consultation with us  on 10/30/2023, labs showed  platelet count of 402,000, nearly normal.  White count 5500 with normal differential.  Hemoglobin normal at 14.1.  CMP, iron  studies, ESR, CRP, TSH were all within normal limits.  JAK2 V617F mutation analysis was negative, followed by reflex testing to include CALR, MPL, exon 12-15 mutations were all negative.  No evidence of primary myeloproliferative neoplasm.   Chronic inflammation from foot injury and recurrent UTIs may contribute to elevated platelet levels.   Lately her platelet count has been within normal limits.  No additional hematological intervention or workup is needed.  She can be discharged from our office for continued follow-up with her PCP.  She was provided reassurance.   I have reviewed the past medical history, past surgical history, social history and family history with the patient and they are unchanged from previous note.  ALLERGIES: She is allergic to sumatriptan  and atorvastatin.  MEDICATIONS:  Current Outpatient Medications  Medication Sig Dispense Refill   cetirizine (ZYRTEC) 10 MG tablet Take 10 mg by mouth daily.     Cholecalciferol (VITAMIN D3) 50 MCG (2000 UT) CHEW Chew 1 tablet by mouth daily.     famotidine  (PEPCID  AC) 10 MG tablet Take 1 tablet (10 mg total) by mouth 2 (two) times daily. 180 tablet 0   Iron , Ferrous Sulfate , 325 (65 Fe) MG TABS Take 325 mg by mouth daily. 30 tablet 2   OVER THE COUNTER MEDICATION  Take 4 tablets by mouth daily. Nutrafol Supplement for hair, skin, and nails     pravastatin  (PRAVACHOL ) 10 MG tablet Take 1 tablet (10 mg total) by mouth daily. 90 tablet 3   No current facility-administered medications for this visit.     REVIEW OF SYSTEMS:    Review of Systems - Oncology  All other pertinent systems were reviewed with the patient and are negative.  PHYSICAL EXAMINATION:    Onc Performance Status - 05/07/24 1131       ECOG Perf Status   ECOG Perf Status Fully active, able to carry on all pre-disease performance  without restriction      KPS SCALE   KPS % SCORE Normal activity with effort, some s/s of disease          Vitals:   05/07/24 1120  BP: (!) 97/41  Pulse: 68  Resp: 17  Temp: 97.9 F (36.6 C)  SpO2: 98%   Filed Weights   05/07/24 1120  Weight: 190 lb (86.2 kg)    Physical Exam Constitutional:      General: She is not in acute distress.    Appearance: Normal appearance.  HENT:     Head: Normocephalic and atraumatic.  Cardiovascular:     Rate and Rhythm: Normal rate.  Pulmonary:     Effort: Pulmonary effort is normal. No respiratory distress.  Abdominal:     General: There is no distension.  Neurological:     General: No focal deficit present.     Mental Status: She is alert and oriented to person, place, and time.  Psychiatric:        Mood and Affect: Mood normal.        Behavior: Behavior normal.     LABORATORY DATA:   I have reviewed the data as listed.  Results for orders placed or performed in visit on 05/07/24  CBC with Differential (Cancer Center Only)  Result Value Ref Range   WBC Count 6.4 4.0 - 10.5 K/uL   RBC 4.62 3.87 - 5.11 MIL/uL   Hemoglobin 14.0 12.0 - 15.0 g/dL   HCT 58.6 63.9 - 53.9 %   MCV 89.4 80.0 - 100.0 fL   MCH 30.3 26.0 - 34.0 pg   MCHC 33.9 30.0 - 36.0 g/dL   RDW 87.0 88.4 - 84.4 %   Platelet Count 383 150 - 400 K/uL   nRBC 0.0 0.0 - 0.2 %   Neutrophils Relative % 50 %   Neutro Abs 3.2 1.7 - 7.7 K/uL   Lymphocytes Relative 35 %   Lymphs Abs 2.2 0.7 - 4.0 K/uL   Monocytes Relative 10 %   Monocytes Absolute 0.6 0.1 - 1.0 K/uL   Eosinophils Relative 4 %   Eosinophils Absolute 0.2 0.0 - 0.5 K/uL   Basophils Relative 1 %   Basophils Absolute 0.1 0.0 - 0.1 K/uL   Immature Granulocytes 0 %   Abs Immature Granulocytes 0.01 0.00 - 0.07 K/uL    RADIOGRAPHIC STUDIES:  I have personally reviewed the radiological images as listed and agree with the findings in the report.  MM 3D SCREENING MAMMOGRAM BILATERAL BREAST CLINICAL  DATA:  Screening.  EXAM: DIGITAL SCREENING BILATERAL MAMMOGRAM WITH TOMOSYNTHESIS AND CAD  TECHNIQUE: Bilateral screening digital craniocaudal and mediolateral oblique mammograms were obtained. Bilateral screening digital breast tomosynthesis was performed. The images were evaluated with computer-aided detection.  COMPARISON:  Previous exam(s).  ACR Breast Density Category c: The breasts are heterogeneously dense, which may obscure small  masses.  FINDINGS: There are no findings suspicious for malignancy.  IMPRESSION: No mammographic evidence of malignancy. A result letter of this screening mammogram will be mailed directly to the patient.  RECOMMENDATION: Screening mammogram in one year. (Code:SM-B-01Y)  BI-RADS CATEGORY  1: Negative.  Electronically Signed   By: Curtistine Noble   On: 04/01/2024 14:48   No future appointments.   This document was completed utilizing speech recognition software. Grammatical errors, random word insertions, pronoun errors, and incomplete sentences are an occasional consequence of this system due to software limitations, ambient noise, and hardware issues. Any formal questions or concerns about the content, text or information contained within the body of this dictation should be directly addressed to the provider for clarification.

## 2024-05-08 ENCOUNTER — Encounter: Payer: Self-pay | Admitting: Oncology

## 2024-05-15 ENCOUNTER — Telehealth: Admitting: Family Medicine

## 2024-05-15 DIAGNOSIS — N3000 Acute cystitis without hematuria: Secondary | ICD-10-CM

## 2024-05-15 MED ORDER — NITROFURANTOIN MONOHYD MACRO 100 MG PO CAPS: 100.0000 mg | ORAL_CAPSULE | Freq: Two times a day (BID) | ORAL | 0 refills | Status: AC

## 2024-05-15 NOTE — Patient Instructions (Signed)
 utiUrinary Tract Infection, Female A urinary tract infection (UTI) is an infection in your urinary tract. The urinary tract is made up of organs that make, store, and get rid of pee (urine) in your body. These organs include: The kidneys. The ureters. The bladder. The urethra. What are the causes? Most UTIs are caused by germs called bacteria. They may be in or near your genitals. These germs grow and cause swelling in your urinary tract. What increases the risk? You're more likely to get a UTI if: You're a female. The urethra is shorter in females than in males. You have a soft tube called a catheter that drains your pee. You can't control when you pee or poop. You have trouble peeing because of: A kidney stone. A urinary blockage. A nerve condition that affects your bladder. Not getting enough to drink. You're sexually active. You use a birth control inside your vagina, like spermicide. You're pregnant. You have low levels of the hormone estrogen in your body. You're an older adult. You're also more likely to get a UTI if you have other health problems. These may include: Diabetes. A weak immune system. Your immune system is your body's defense system. Sickle cell disease. Injury of the spine. What are the signs or symptoms? Symptoms may include: Needing to pee right away. Peeing small amounts often. Pain or burning when you pee. Blood in your pee. Pee that smells bad or odd. Pain in your belly or lower back. You may also: Feel confused. This may be the first symptom in older adults. Vomit. Not feel hungry. Feel tired or easily annoyed. Have a fever or chills. How is this diagnosed? A UTI is diagnosed based on your medical history and an exam. You may also have other tests. These may include: Pee tests. Blood tests. Tests for sexually transmitted infections (STIs). If you've had more than one UTI, you may need to have imaging studies done to find out why you keep  getting them. How is this treated? A UTI can be treated by: Taking antibiotics or other medicines. Drinking enough fluid to keep your pee pale yellow. In rare cases, a UTI can cause a very bad condition called sepsis. Sepsis may be treated in the hospital. Follow these instructions at home: Medicines Take your medicines only as told by your health care provider. If you were given antibiotics, take them as told by your provider. Do not stop taking them even if you start to feel better. General instructions Make sure you: Pee often and fully. Do not hold your pee for a long time. Wipe from front to back after you pee or poop. Use each tissue only once when you wipe. Pee after you have sex. Do not douche or use sprays or powders in your genital area. Contact a health care provider if: Your symptoms don't get better after 1-2 days of taking antibiotics. Your symptoms go away and then come back. You have a fever or chills. You vomit or feel like you may vomit. Get help right away if: You have very bad pain in your back or lower belly. You faint. This information is not intended to replace advice given to you by your health care provider. Make sure you discuss any questions you have with your health care provider. Document Revised: 05/01/2023 Document Reviewed: 08/24/2022 Elsevier Patient Education  2025 [employer].

## 2024-05-15 NOTE — Progress Notes (Signed)
 Virtual Visit Consent   Tayli Ihrig, you are scheduled for a virtual visit with a [facility] provider today. Just as with appointments in the office, your consent must be obtained to participate. Your consent will be active for this visit and any virtual visit you may have with one of our providers in the next 365 days. If you have a MyChart account, a copy of this consent can be sent to you electronically.  As this is a virtual visit, video technology does not allow for your provider to perform a traditional examination. This may limit your provider's ability to fully assess your condition. If your provider identifies any concerns that need to be evaluated in person or the need to arrange testing (such as labs, EKG, etc.), we will make arrangements to do so. Although advances in technology are sophisticated, we cannot ensure that it will always work on either your end or our end. If the connection with a video visit is poor, the visit may have to be switched to a telephone visit. With either a video or telephone visit, we are not always able to ensure that we have a secure connection.  By engaging in this virtual visit, you consent to the provision of healthcare and authorize for your insurance to be billed (if applicable) for the services provided during this visit. Depending on your insurance coverage, you may receive a charge related to this service.  I need to obtain your verbal consent now. Are you willing to proceed with your visit today? Arasely Facemire has provided verbal consent on 05/15/2024 for a virtual visit (video or telephone). Loa Lamp, FNP  Date: 05/15/2024 11:44 AM   Virtual Visit via Video Note   I, Loa Lamp, connected with  Chiquitta Matty  ([id number], July 01, 1970) on 05/15/2024 at 11:45 AM EST by a video-enabled telemedicine application and verified that I am speaking with the correct person using two identifiers.  Location: Patient: Virtual Visit Location Patient: Home Provider:  Virtual Visit Location Provider: Home Office   I discussed the limitations of evaluation and management by telemedicine and the availability of in person appointments. The patient expressed understanding and agreed to proceed.    History of Present Illness: Robin Benton is a 53 y.o. who identifies as a female who was assigned female at birth, and is being seen today for UTI SX including burning, frequency, drinks a lot of water , sx for a week, pressure.   HPI: HPI  Problems:  Patient Active Problem List   Diagnosis Date Noted   History of breast lump/mass excision 01/03/2024   Thrombocytosis 11/01/2023   Long-term current use of proton pump inhibitor therapy 07/02/2023   History of colon polyps 07/02/2023   Non-seasonal allergic rhinitis 06/11/2023   Menopausal and female climacteric states 06/08/2022   Elevated LDL cholesterol level 06/08/2022   Synovitis and tenosynovitis 05/01/2022   Rosacea 08/03/2021   Functional urinary incontinence 08/03/2021   Iron  deficiency anemia secondary to inadequate dietary iron  intake 08/03/2021   Migraine with aura and without status migrainosus, not intractable 05/24/2020   Class 1 obesity due to excess calories without serious comorbidity with body mass index (BMI) of 32.0 to 32.9 in adult 01/09/2020   Vitamin D  deficiency 01/09/2020   Gastroesophageal reflux disease 01/09/2020    Allergies: Allergies[1] Medications: Current Medications[2]  Observations/Objective: Patient is well-developed, well-nourished in no acute distress.  Resting comfortably  at home.  Head is normocephalic, atraumatic.  No labored breathing.  Speech is clear and coherent with  logical content.  Patient is alert and oriented at baseline.    Assessment and Plan: 1. Acute cystitis without hematuria (Primary)  Increase fluids, UC if sx worsen.   Follow Up Instructions: I discussed the assessment and treatment plan with the patient. The patient was provided an opportunity  to ask questions and all were answered. The patient agreed with the plan and demonstrated an understanding of the instructions.  A copy of instructions were sent to the patient via MyChart unless otherwise noted below.     The patient was advised to call back or seek an in-person evaluation if the symptoms worsen or if the condition fails to improve as anticipated.    Kylei Purington, FNP     [1]  Allergies Allergen Reactions   Sumatriptan  Other (See Comments)    Made her feel weird   Atorvastatin Other (See Comments)    myalgia  [2]  Current Outpatient Medications:    cetirizine (ZYRTEC) 10 MG tablet, Take 10 mg by mouth daily., Disp: , Rfl:    Cholecalciferol (VITAMIN D3) 50 MCG (2000 UT) CHEW, Chew 1 tablet by mouth daily., Disp: , Rfl:    famotidine  (PEPCID  AC) 10 MG tablet, Take 1 tablet (10 mg total) by mouth 2 (two) times daily., Disp: 180 tablet, Rfl: 0   Iron , Ferrous Sulfate , 325 (65 Fe) MG TABS, Take 325 mg by mouth daily., Disp: 30 tablet, Rfl: 2   OVER THE COUNTER MEDICATION, Take 4 tablets by mouth daily. Nutrafol Supplement for hair, skin, and nails, Disp: , Rfl:    pravastatin  (PRAVACHOL ) 10 MG tablet, Take 1 tablet (10 mg total) by mouth daily., Disp: 90 tablet, Rfl: 3

## 2024-06-02 ENCOUNTER — Other Ambulatory Visit: Payer: Self-pay | Admitting: Family

## 2024-06-02 DIAGNOSIS — E782 Mixed hyperlipidemia: Secondary | ICD-10-CM

## 2024-06-02 NOTE — Telephone Encounter (Unsigned)
 Copied from CRM #[id number]. Topic: Clinical - Medication Refill >> Jun 02, 2024  5:00 PM Jeshua R wrote: Medication:  pravastatin  (PRAVACHOL ) 10 MG tablet   Has the patient contacted their pharmacy? Yes   This is the patient's preferred pharmacy:  [id number] - [place], [place] - [address] AT [facility] [address] [facility] [address] [place] [address] Phone: [phone] Fax: [phone]  Is this the correct pharmacy for this prescription? Yes If no, delete pharmacy and type the correct one.   Has the prescription been filled recently? Yes  Is the patient out of the medication? No  Has the patient been seen for an appointment in the last year OR does the patient have an upcoming appointment? Yes, scheduled for physical in May 2026  Can we respond through MyChart? Yes  Agent: Please be advised that Rx refills may take up to 3 business days. We ask that you follow-up with your pharmacy.

## 2024-06-03 MED ORDER — PRAVASTATIN SODIUM 10 MG PO TABS: 10.0000 mg | ORAL_TABLET | Freq: Every day | ORAL | 0 refills | Status: AC

## 2024-06-15 ENCOUNTER — Encounter: Payer: Self-pay | Admitting: *Deleted

## 2024-10-28 ENCOUNTER — Encounter: Admitting: Family
